# Patient Record
Sex: Female | Born: 1950
Health system: Southern US, Community
[De-identification: ages and names within clinical notes are randomized; demographics above are authoritative.]

## PROBLEM LIST (undated history)

## (undated) DIAGNOSIS — I10 Essential (primary) hypertension: Secondary | ICD-10-CM

## (undated) DIAGNOSIS — E785 Hyperlipidemia, unspecified: Secondary | ICD-10-CM

## (undated) DIAGNOSIS — R0600 Dyspnea, unspecified: Secondary | ICD-10-CM

## (undated) HISTORY — DX: Essential (primary) hypertension: I10

## (undated) HISTORY — DX: Hyperlipidemia, unspecified: E78.5

---

## 2004-11-14 ENCOUNTER — Emergency Department: Payer: Self-pay | Admitting: Emergency Medicine

## 2014-01-24 DIAGNOSIS — E782 Mixed hyperlipidemia: Secondary | ICD-10-CM | POA: Diagnosis not present

## 2014-01-24 DIAGNOSIS — G4700 Insomnia, unspecified: Secondary | ICD-10-CM | POA: Diagnosis not present

## 2014-01-24 DIAGNOSIS — I10 Essential (primary) hypertension: Secondary | ICD-10-CM | POA: Diagnosis not present

## 2014-01-24 DIAGNOSIS — F331 Major depressive disorder, recurrent, moderate: Secondary | ICD-10-CM | POA: Diagnosis not present

## 2014-07-17 DIAGNOSIS — E559 Vitamin D deficiency, unspecified: Secondary | ICD-10-CM | POA: Diagnosis not present

## 2014-07-17 DIAGNOSIS — F331 Major depressive disorder, recurrent, moderate: Secondary | ICD-10-CM | POA: Diagnosis not present

## 2014-07-17 DIAGNOSIS — Z0001 Encounter for general adult medical examination with abnormal findings: Secondary | ICD-10-CM | POA: Diagnosis not present

## 2014-07-17 DIAGNOSIS — I10 Essential (primary) hypertension: Secondary | ICD-10-CM | POA: Diagnosis not present

## 2014-07-17 DIAGNOSIS — R0602 Shortness of breath: Secondary | ICD-10-CM | POA: Diagnosis not present

## 2014-07-17 DIAGNOSIS — E782 Mixed hyperlipidemia: Secondary | ICD-10-CM | POA: Diagnosis not present

## 2014-08-07 DIAGNOSIS — R079 Chest pain, unspecified: Secondary | ICD-10-CM | POA: Diagnosis not present

## 2014-08-07 DIAGNOSIS — E782 Mixed hyperlipidemia: Secondary | ICD-10-CM | POA: Diagnosis not present

## 2014-08-07 DIAGNOSIS — I10 Essential (primary) hypertension: Secondary | ICD-10-CM | POA: Diagnosis not present

## 2014-08-09 DIAGNOSIS — R079 Chest pain, unspecified: Secondary | ICD-10-CM | POA: Diagnosis not present

## 2014-08-09 DIAGNOSIS — R0602 Shortness of breath: Secondary | ICD-10-CM | POA: Diagnosis not present

## 2014-08-21 DIAGNOSIS — R0602 Shortness of breath: Secondary | ICD-10-CM | POA: Diagnosis not present

## 2014-08-21 DIAGNOSIS — I10 Essential (primary) hypertension: Secondary | ICD-10-CM | POA: Diagnosis not present

## 2014-08-21 DIAGNOSIS — Z79899 Other long term (current) drug therapy: Secondary | ICD-10-CM | POA: Diagnosis not present

## 2014-08-29 DIAGNOSIS — I119 Hypertensive heart disease without heart failure: Secondary | ICD-10-CM | POA: Diagnosis not present

## 2014-08-29 DIAGNOSIS — N184 Chronic kidney disease, stage 4 (severe): Secondary | ICD-10-CM | POA: Diagnosis not present

## 2014-09-06 DIAGNOSIS — Z79899 Other long term (current) drug therapy: Secondary | ICD-10-CM | POA: Diagnosis not present

## 2014-09-06 DIAGNOSIS — I10 Essential (primary) hypertension: Secondary | ICD-10-CM | POA: Diagnosis not present

## 2014-09-06 DIAGNOSIS — E782 Mixed hyperlipidemia: Secondary | ICD-10-CM | POA: Diagnosis not present

## 2014-09-06 DIAGNOSIS — N184 Chronic kidney disease, stage 4 (severe): Secondary | ICD-10-CM | POA: Diagnosis not present

## 2014-09-06 DIAGNOSIS — F331 Major depressive disorder, recurrent, moderate: Secondary | ICD-10-CM | POA: Diagnosis not present

## 2014-09-17 ENCOUNTER — Telehealth: Payer: Self-pay | Admitting: Gastroenterology

## 2014-09-17 NOTE — Telephone Encounter (Signed)
Colonoscopy triage °

## 2014-09-19 ENCOUNTER — Telehealth: Payer: Self-pay | Admitting: Gastroenterology

## 2014-09-19 NOTE — Telephone Encounter (Signed)
Patient not ready to schedule colonoscopy at this time

## 2014-09-19 NOTE — Telephone Encounter (Signed)
Patient does not want to schedule colonoscopy at this time

## 2014-10-10 DIAGNOSIS — Z1231 Encounter for screening mammogram for malignant neoplasm of breast: Secondary | ICD-10-CM | POA: Diagnosis not present

## 2014-10-16 DIAGNOSIS — I10 Essential (primary) hypertension: Secondary | ICD-10-CM | POA: Diagnosis not present

## 2014-10-17 DIAGNOSIS — I10 Essential (primary) hypertension: Secondary | ICD-10-CM | POA: Diagnosis not present

## 2014-10-17 DIAGNOSIS — E875 Hyperkalemia: Secondary | ICD-10-CM | POA: Diagnosis not present

## 2014-10-17 DIAGNOSIS — N3090 Cystitis, unspecified without hematuria: Secondary | ICD-10-CM | POA: Diagnosis not present

## 2014-10-17 DIAGNOSIS — E785 Hyperlipidemia, unspecified: Secondary | ICD-10-CM | POA: Diagnosis not present

## 2014-10-26 DIAGNOSIS — R928 Other abnormal and inconclusive findings on diagnostic imaging of breast: Secondary | ICD-10-CM | POA: Diagnosis not present

## 2014-10-26 DIAGNOSIS — R922 Inconclusive mammogram: Secondary | ICD-10-CM | POA: Diagnosis not present

## 2014-11-06 DIAGNOSIS — R7989 Other specified abnormal findings of blood chemistry: Secondary | ICD-10-CM | POA: Insufficient documentation

## 2014-11-06 DIAGNOSIS — I10 Essential (primary) hypertension: Secondary | ICD-10-CM | POA: Diagnosis not present

## 2014-11-08 DIAGNOSIS — F331 Major depressive disorder, recurrent, moderate: Secondary | ICD-10-CM | POA: Diagnosis not present

## 2014-11-08 DIAGNOSIS — G4700 Insomnia, unspecified: Secondary | ICD-10-CM | POA: Diagnosis not present

## 2014-11-08 DIAGNOSIS — N184 Chronic kidney disease, stage 4 (severe): Secondary | ICD-10-CM | POA: Diagnosis not present

## 2014-11-08 DIAGNOSIS — E875 Hyperkalemia: Secondary | ICD-10-CM | POA: Diagnosis not present

## 2014-11-08 DIAGNOSIS — E782 Mixed hyperlipidemia: Secondary | ICD-10-CM | POA: Diagnosis not present

## 2015-02-01 DIAGNOSIS — I10 Essential (primary) hypertension: Secondary | ICD-10-CM | POA: Diagnosis not present

## 2015-02-07 DIAGNOSIS — G4700 Insomnia, unspecified: Secondary | ICD-10-CM | POA: Diagnosis not present

## 2015-02-07 DIAGNOSIS — I119 Hypertensive heart disease without heart failure: Secondary | ICD-10-CM | POA: Diagnosis not present

## 2015-02-07 DIAGNOSIS — I10 Essential (primary) hypertension: Secondary | ICD-10-CM | POA: Diagnosis not present

## 2015-03-04 DIAGNOSIS — I10 Essential (primary) hypertension: Secondary | ICD-10-CM | POA: Diagnosis not present

## 2015-03-04 DIAGNOSIS — E875 Hyperkalemia: Secondary | ICD-10-CM | POA: Diagnosis not present

## 2015-03-06 DIAGNOSIS — N183 Chronic kidney disease, stage 3 (moderate): Secondary | ICD-10-CM

## 2015-03-06 DIAGNOSIS — N1831 Chronic kidney disease, stage 3a: Secondary | ICD-10-CM | POA: Insufficient documentation

## 2015-06-13 DIAGNOSIS — I10 Essential (primary) hypertension: Secondary | ICD-10-CM | POA: Diagnosis not present

## 2015-06-13 DIAGNOSIS — E782 Mixed hyperlipidemia: Secondary | ICD-10-CM | POA: Diagnosis not present

## 2015-07-08 ENCOUNTER — Ambulatory Visit
Admission: RE | Admit: 2015-07-08 | Discharge: 2015-07-08 | Disposition: A | Discharge status: Home / Self Care | Payer: Medicare Other | Source: Ambulatory Visit | Attending: Physician Assistant | Admitting: Physician Assistant

## 2015-07-08 ENCOUNTER — Inpatient Hospital Stay: Admit: 2015-07-08 | Payer: Self-pay

## 2015-07-08 ENCOUNTER — Other Ambulatory Visit: Payer: Self-pay | Admitting: Physician Assistant

## 2015-07-08 DIAGNOSIS — I998 Other disorder of circulatory system: Secondary | ICD-10-CM | POA: Diagnosis not present

## 2015-07-08 DIAGNOSIS — I708 Atherosclerosis of other arteries: Secondary | ICD-10-CM | POA: Diagnosis not present

## 2015-07-08 DIAGNOSIS — M47896 Other spondylosis, lumbar region: Secondary | ICD-10-CM | POA: Diagnosis not present

## 2015-07-08 DIAGNOSIS — M5440 Lumbago with sciatica, unspecified side: Secondary | ICD-10-CM

## 2015-07-08 DIAGNOSIS — M4316 Spondylolisthesis, lumbar region: Secondary | ICD-10-CM | POA: Insufficient documentation

## 2015-07-08 DIAGNOSIS — M545 Low back pain: Secondary | ICD-10-CM | POA: Diagnosis present

## 2015-07-08 DIAGNOSIS — I700 Atherosclerosis of aorta: Secondary | ICD-10-CM | POA: Diagnosis not present

## 2015-07-08 DIAGNOSIS — M47816 Spondylosis without myelopathy or radiculopathy, lumbar region: Secondary | ICD-10-CM | POA: Diagnosis not present

## 2015-07-23 DIAGNOSIS — M5442 Lumbago with sciatica, left side: Secondary | ICD-10-CM | POA: Diagnosis not present

## 2015-07-23 DIAGNOSIS — I739 Peripheral vascular disease, unspecified: Secondary | ICD-10-CM | POA: Diagnosis not present

## 2015-07-23 DIAGNOSIS — E782 Mixed hyperlipidemia: Secondary | ICD-10-CM | POA: Diagnosis not present

## 2015-07-23 DIAGNOSIS — Z0001 Encounter for general adult medical examination with abnormal findings: Secondary | ICD-10-CM | POA: Diagnosis not present

## 2015-07-23 DIAGNOSIS — Z124 Encounter for screening for malignant neoplasm of cervix: Secondary | ICD-10-CM | POA: Diagnosis not present

## 2015-07-23 DIAGNOSIS — I119 Hypertensive heart disease without heart failure: Secondary | ICD-10-CM | POA: Diagnosis not present

## 2015-07-31 DIAGNOSIS — I739 Peripheral vascular disease, unspecified: Secondary | ICD-10-CM | POA: Diagnosis not present

## 2015-09-13 DIAGNOSIS — I6523 Occlusion and stenosis of bilateral carotid arteries: Secondary | ICD-10-CM | POA: Diagnosis not present

## 2015-09-13 DIAGNOSIS — N184 Chronic kidney disease, stage 4 (severe): Secondary | ICD-10-CM | POA: Diagnosis not present

## 2015-09-13 DIAGNOSIS — I10 Essential (primary) hypertension: Secondary | ICD-10-CM | POA: Diagnosis not present

## 2015-09-13 DIAGNOSIS — E782 Mixed hyperlipidemia: Secondary | ICD-10-CM | POA: Diagnosis not present

## 2015-09-13 DIAGNOSIS — M5442 Lumbago with sciatica, left side: Secondary | ICD-10-CM | POA: Diagnosis not present

## 2015-12-03 DIAGNOSIS — I10 Essential (primary) hypertension: Secondary | ICD-10-CM | POA: Diagnosis not present

## 2015-12-03 DIAGNOSIS — M722 Plantar fascial fibromatosis: Secondary | ICD-10-CM | POA: Diagnosis not present

## 2015-12-03 DIAGNOSIS — N184 Chronic kidney disease, stage 4 (severe): Secondary | ICD-10-CM | POA: Diagnosis not present

## 2016-03-10 DIAGNOSIS — M79672 Pain in left foot: Secondary | ICD-10-CM | POA: Diagnosis not present

## 2016-03-10 DIAGNOSIS — M19079 Primary osteoarthritis, unspecified ankle and foot: Secondary | ICD-10-CM | POA: Diagnosis not present

## 2016-03-10 DIAGNOSIS — M79671 Pain in right foot: Secondary | ICD-10-CM | POA: Diagnosis not present

## 2016-04-01 DIAGNOSIS — I6523 Occlusion and stenosis of bilateral carotid arteries: Secondary | ICD-10-CM | POA: Diagnosis not present

## 2016-07-01 DIAGNOSIS — N184 Chronic kidney disease, stage 4 (severe): Secondary | ICD-10-CM | POA: Diagnosis not present

## 2016-07-01 DIAGNOSIS — I6523 Occlusion and stenosis of bilateral carotid arteries: Secondary | ICD-10-CM | POA: Diagnosis not present

## 2016-07-01 DIAGNOSIS — I10 Essential (primary) hypertension: Secondary | ICD-10-CM | POA: Diagnosis not present

## 2016-07-07 DIAGNOSIS — R809 Proteinuria, unspecified: Secondary | ICD-10-CM | POA: Diagnosis not present

## 2016-07-07 DIAGNOSIS — N184 Chronic kidney disease, stage 4 (severe): Secondary | ICD-10-CM | POA: Diagnosis not present

## 2016-07-07 DIAGNOSIS — I10 Essential (primary) hypertension: Secondary | ICD-10-CM | POA: Diagnosis not present

## 2016-07-07 DIAGNOSIS — Z0001 Encounter for general adult medical examination with abnormal findings: Secondary | ICD-10-CM | POA: Diagnosis not present

## 2016-07-07 DIAGNOSIS — E559 Vitamin D deficiency, unspecified: Secondary | ICD-10-CM | POA: Diagnosis not present

## 2016-07-07 DIAGNOSIS — I739 Peripheral vascular disease, unspecified: Secondary | ICD-10-CM | POA: Diagnosis not present

## 2016-07-14 DIAGNOSIS — N184 Chronic kidney disease, stage 4 (severe): Secondary | ICD-10-CM | POA: Diagnosis not present

## 2016-07-14 DIAGNOSIS — Z7689 Persons encountering health services in other specified circumstances: Secondary | ICD-10-CM | POA: Diagnosis not present

## 2016-07-14 DIAGNOSIS — R809 Proteinuria, unspecified: Secondary | ICD-10-CM | POA: Diagnosis not present

## 2016-07-28 DIAGNOSIS — N390 Urinary tract infection, site not specified: Secondary | ICD-10-CM | POA: Diagnosis not present

## 2016-07-28 DIAGNOSIS — R42 Dizziness and giddiness: Secondary | ICD-10-CM | POA: Diagnosis not present

## 2016-07-28 DIAGNOSIS — E559 Vitamin D deficiency, unspecified: Secondary | ICD-10-CM | POA: Diagnosis not present

## 2016-07-28 DIAGNOSIS — I10 Essential (primary) hypertension: Secondary | ICD-10-CM | POA: Diagnosis not present

## 2016-07-28 DIAGNOSIS — Z0001 Encounter for general adult medical examination with abnormal findings: Secondary | ICD-10-CM | POA: Diagnosis not present

## 2016-07-28 DIAGNOSIS — E782 Mixed hyperlipidemia: Secondary | ICD-10-CM | POA: Diagnosis not present

## 2016-08-03 DIAGNOSIS — R809 Proteinuria, unspecified: Secondary | ICD-10-CM | POA: Diagnosis not present

## 2016-08-03 DIAGNOSIS — I10 Essential (primary) hypertension: Secondary | ICD-10-CM | POA: Diagnosis not present

## 2016-08-03 DIAGNOSIS — N184 Chronic kidney disease, stage 4 (severe): Secondary | ICD-10-CM | POA: Diagnosis not present

## 2016-08-03 DIAGNOSIS — N2581 Secondary hyperparathyroidism of renal origin: Secondary | ICD-10-CM | POA: Diagnosis not present

## 2016-08-10 ENCOUNTER — Other Ambulatory Visit: Payer: Self-pay | Admitting: Nephrology

## 2016-08-10 DIAGNOSIS — Z905 Acquired absence of kidney: Secondary | ICD-10-CM

## 2016-08-12 DIAGNOSIS — Z1231 Encounter for screening mammogram for malignant neoplasm of breast: Secondary | ICD-10-CM | POA: Diagnosis not present

## 2016-08-17 ENCOUNTER — Ambulatory Visit
Admission: RE | Admit: 2016-08-17 | Discharge: 2016-08-17 | Disposition: A | Discharge status: Home / Self Care | Payer: Medicare Other | Source: Ambulatory Visit | Attending: Nephrology | Admitting: Nephrology

## 2016-08-17 DIAGNOSIS — I700 Atherosclerosis of aorta: Secondary | ICD-10-CM | POA: Insufficient documentation

## 2016-08-17 DIAGNOSIS — N2889 Other specified disorders of kidney and ureter: Secondary | ICD-10-CM | POA: Diagnosis not present

## 2016-08-17 DIAGNOSIS — Z905 Acquired absence of kidney: Secondary | ICD-10-CM | POA: Diagnosis present

## 2016-08-17 DIAGNOSIS — N261 Atrophy of kidney (terminal): Secondary | ICD-10-CM | POA: Diagnosis not present

## 2016-08-25 DIAGNOSIS — R809 Proteinuria, unspecified: Secondary | ICD-10-CM | POA: Diagnosis not present

## 2016-08-25 DIAGNOSIS — I10 Essential (primary) hypertension: Secondary | ICD-10-CM | POA: Diagnosis not present

## 2016-08-25 DIAGNOSIS — N184 Chronic kidney disease, stage 4 (severe): Secondary | ICD-10-CM | POA: Diagnosis not present

## 2016-10-20 DIAGNOSIS — N184 Chronic kidney disease, stage 4 (severe): Secondary | ICD-10-CM | POA: Diagnosis not present

## 2016-10-20 DIAGNOSIS — R809 Proteinuria, unspecified: Secondary | ICD-10-CM | POA: Diagnosis not present

## 2016-10-20 DIAGNOSIS — N2581 Secondary hyperparathyroidism of renal origin: Secondary | ICD-10-CM | POA: Diagnosis not present

## 2016-10-20 DIAGNOSIS — I10 Essential (primary) hypertension: Secondary | ICD-10-CM | POA: Diagnosis not present

## 2016-10-26 DIAGNOSIS — I10 Essential (primary) hypertension: Secondary | ICD-10-CM | POA: Diagnosis not present

## 2016-10-26 DIAGNOSIS — N184 Chronic kidney disease, stage 4 (severe): Secondary | ICD-10-CM | POA: Diagnosis not present

## 2016-10-26 DIAGNOSIS — N390 Urinary tract infection, site not specified: Secondary | ICD-10-CM | POA: Diagnosis not present

## 2016-10-27 DIAGNOSIS — N184 Chronic kidney disease, stage 4 (severe): Secondary | ICD-10-CM | POA: Diagnosis not present

## 2016-10-27 DIAGNOSIS — I10 Essential (primary) hypertension: Secondary | ICD-10-CM | POA: Diagnosis not present

## 2016-10-27 DIAGNOSIS — R809 Proteinuria, unspecified: Secondary | ICD-10-CM | POA: Diagnosis not present

## 2016-10-27 DIAGNOSIS — N2581 Secondary hyperparathyroidism of renal origin: Secondary | ICD-10-CM | POA: Diagnosis not present

## 2016-11-16 ENCOUNTER — Encounter (INDEPENDENT_AMBULATORY_CARE_PROVIDER_SITE_OTHER): Payer: Self-pay

## 2016-11-16 ENCOUNTER — Encounter: Payer: Self-pay | Admitting: Gastroenterology

## 2016-11-16 ENCOUNTER — Ambulatory Visit (INDEPENDENT_AMBULATORY_CARE_PROVIDER_SITE_OTHER): Payer: Medicare Other | Admitting: Gastroenterology

## 2016-11-16 VITALS — BP 136/76 | HR 84 | Temp 97.8°F | Ht 60.0 in | Wt 131.4 lb

## 2016-11-16 DIAGNOSIS — R933 Abnormal findings on diagnostic imaging of other parts of digestive tract: Secondary | ICD-10-CM

## 2016-11-16 DIAGNOSIS — E875 Hyperkalemia: Secondary | ICD-10-CM | POA: Insufficient documentation

## 2016-11-16 DIAGNOSIS — I10 Essential (primary) hypertension: Secondary | ICD-10-CM | POA: Insufficient documentation

## 2016-11-16 DIAGNOSIS — E785 Hyperlipidemia, unspecified: Secondary | ICD-10-CM | POA: Insufficient documentation

## 2016-11-16 NOTE — Progress Notes (Signed)
[name] MD, MRCP(U.K) [address]  [place]  [place], [place] [address]  Main: [phone]  Fax: [phone]   Gastroenterology Consultation  Referring Provider:     Associates, Sanders Car* Primary Care Physician:  Lavera Guise, MD Primary Gastroenterologist:  Dr. [name]  Reason for Consultation:     Abnormal CT        HPI:   Julia Davidson is a 66 y.o. y/o female referred for consultation & management  by Dr. Humphrey Rolls, Timoteo Gaul, MD. She has been referred for abnormality of her recent CAT scan which revealed a distal esophageal and proximal stomach wall thickening.  The CT scan was performed on 08/17/2016.  And she was referred here to undergo an endoscopy.The  CT scan was ordered for indications of an absent    Denies any issues with swallowing , gained and lost some weight . No family history of esophageal cancer. She chews tobacco.   No past medical history on file.  No past surgical history on file.  Prior to Admission medications   Medication Sig Start Date End Date Taking? Authorizing Provider  amLODipine (NORVASC) 10 MG tablet Take 10 mg by mouth. 11/06/14  Yes [provider]  chlorthalidone (HYGROTON) 25 MG tablet Take 12.5 mg by mouth. 02/07/15  Yes [provider]  aspirin (GOODSENSE ASPIRIN) 325 MG tablet Take 325 mg by mouth.    [provider]  chlorthalidone (HYGROTON) 25 MG tablet Take 25 mg daily by mouth. 10/25/16   [provider]  citalopram (CELEXA) 20 MG tablet take 1 tablet by mouth once daily WITH SUPPER FOR DEPRESSION 10/26/16   [provider]  D3-50 50000 units capsule  10/09/16   [provider]  hydrALAZINE (APRESOLINE) 25 MG tablet Take 25 mg 3 (three) times daily by mouth. 10/20/16   [provider]  losartan (COZAAR) 50 MG tablet Take 50 mg daily by mouth. 11/06/16   [provider]  PROAIR HFA 108 [phone] Base) MCG/ACT inhaler take 2 puffs by mouth every 6 hours if  needed 10/26/16   [provider]  simvastatin (ZOCOR) 40 MG tablet  10/25/16   [provider]  traZODone (DESYREL) 50 MG tablet Take 50 mg at bedtime by mouth. 09/08/16   [provider]    No family history on file.   Social History   Tobacco Use  . Smoking status: Not on file  Substance Use Topics  . Alcohol use: Not on file  . Drug use: Not on file    Allergies as of 11/16/2016  . (Not on File)    Review of Systems:    All systems reviewed and negative except where noted in HPI.   Physical Exam:  There were no vitals taken for this visit. No LMP recorded. Psych:  Alert and cooperative. Normal mood and affect. General:   Alert,  Well-developed, well-nourished, pleasant and cooperative in NAD Head:  Normocephalic and atraumatic. Eyes:  Sclera clear, no icterus.   Conjunctiva pink. Ears:  Normal auditory acuity. Nose:  No deformity, discharge, or lesions. Mouth:  No deformity or lesions,oropharynx pink & moist. Neck:  Supple; no masses or thyromegaly. Lungs:  Respirations even and unlabored.  Clear throughout to auscultation.   No wheezes, crackles, or rhonchi. No acute distress. Heart:  Regular rate and rhythm; no murmurs, clicks, rubs, or gallops. Abdomen:  Normal bowel sounds.  No bruits.  Soft, non-tender and non-distended without masses, hepatosplenomegaly or hernias noted.  No guarding or rebound tenderness.    Neurologic:  Alert and oriented x3;  grossly normal neurologically. Skin:  Intact without significant lesions or rashes. No jaundice. Lymph Nodes:  No significant cervical adenopathy. Psych:  Alert and cooperative. Normal mood and affect.  Imaging Studies: No results found.  Assessment and Plan:   Julia Davidson is a 66 y.o. y/o female has been referred for an abnormal CT scan of the abdomen which revealed thickening of the distal esophagus and stomach.  I have recommended an upper endoscopy to evaluate further.H/o chewing  tobacco, otherwise no symptoms.    I have discussed alternative options, risks & benefits,  which include, but are not limited to, bleeding, infection, perforation,respiratory complication & drug reaction.  The patient agrees with this plan & written consent will be obtained.    Follow up PRN  Dr [name] MD,MRCP(U.K)

## 2016-11-19 ENCOUNTER — Encounter: Payer: Self-pay | Admitting: Anesthesiology

## 2016-11-19 ENCOUNTER — Ambulatory Visit: Payer: Medicare Other | Admitting: Anesthesiology

## 2016-11-19 ENCOUNTER — Ambulatory Visit
Admission: RE | Admit: 2016-11-19 | Discharge: 2016-11-19 | Disposition: A | Discharge status: Home / Self Care | Payer: Medicare Other | Source: Ambulatory Visit | Attending: Gastroenterology | Admitting: Gastroenterology

## 2016-11-19 ENCOUNTER — Encounter
Admission: RE | Disposition: A | Discharge status: Home / Self Care | Payer: Self-pay | Source: Ambulatory Visit | Attending: Gastroenterology

## 2016-11-19 DIAGNOSIS — K229 Disease of esophagus, unspecified: Secondary | ICD-10-CM

## 2016-11-19 DIAGNOSIS — Z7982 Long term (current) use of aspirin: Secondary | ICD-10-CM | POA: Insufficient documentation

## 2016-11-19 DIAGNOSIS — E785 Hyperlipidemia, unspecified: Secondary | ICD-10-CM | POA: Insufficient documentation

## 2016-11-19 DIAGNOSIS — K2980 Duodenitis without bleeding: Secondary | ICD-10-CM | POA: Diagnosis not present

## 2016-11-19 DIAGNOSIS — R933 Abnormal findings on diagnostic imaging of other parts of digestive tract: Secondary | ICD-10-CM

## 2016-11-19 DIAGNOSIS — F17220 Nicotine dependence, chewing tobacco, uncomplicated: Secondary | ICD-10-CM | POA: Diagnosis not present

## 2016-11-19 DIAGNOSIS — K2990 Gastroduodenitis, unspecified, without bleeding: Secondary | ICD-10-CM

## 2016-11-19 DIAGNOSIS — R935 Abnormal findings on diagnostic imaging of other abdominal regions, including retroperitoneum: Secondary | ICD-10-CM | POA: Diagnosis present

## 2016-11-19 DIAGNOSIS — K228 Other specified diseases of esophagus: Secondary | ICD-10-CM | POA: Diagnosis not present

## 2016-11-19 DIAGNOSIS — K209 Esophagitis, unspecified: Secondary | ICD-10-CM | POA: Diagnosis not present

## 2016-11-19 DIAGNOSIS — K2970 Gastritis, unspecified, without bleeding: Secondary | ICD-10-CM | POA: Diagnosis not present

## 2016-11-19 DIAGNOSIS — I10 Essential (primary) hypertension: Secondary | ICD-10-CM | POA: Insufficient documentation

## 2016-11-19 DIAGNOSIS — Z79899 Other long term (current) drug therapy: Secondary | ICD-10-CM | POA: Diagnosis not present

## 2016-11-19 DIAGNOSIS — K449 Diaphragmatic hernia without obstruction or gangrene: Secondary | ICD-10-CM | POA: Diagnosis not present

## 2016-11-19 DIAGNOSIS — D130 Benign neoplasm of esophagus: Secondary | ICD-10-CM | POA: Diagnosis not present

## 2016-11-19 DIAGNOSIS — K529 Noninfective gastroenteritis and colitis, unspecified: Secondary | ICD-10-CM | POA: Diagnosis not present

## 2016-11-19 HISTORY — PX: ESOPHAGOGASTRODUODENOSCOPY (EGD) WITH PROPOFOL: SHX5813

## 2016-11-19 SURGERY — ESOPHAGOGASTRODUODENOSCOPY (EGD) WITH PROPOFOL
Anesthesia: General

## 2016-11-19 MED ORDER — LIDOCAINE HCL (PF) 1 % IJ SOLN
INTRAMUSCULAR | Status: AC
  Administered 2016-11-19: 0.3 mL via INTRADERMAL
  Filled 2016-11-19: qty 2

## 2016-11-19 MED ORDER — GLYCOPYRROLATE 0.2 MG/ML IJ SOLN
INTRAMUSCULAR | Status: AC
  Filled 2016-11-19: qty 1

## 2016-11-19 MED ORDER — FENTANYL CITRATE (PF) 100 MCG/2ML IJ SOLN: 25.0000 ug | INTRAMUSCULAR | Status: DC | PRN

## 2016-11-19 MED ORDER — PROPOFOL 10 MG/ML IV BOLUS
INTRAVENOUS | Status: AC
  Filled 2016-11-19: qty 20

## 2016-11-19 MED ORDER — OMEPRAZOLE 40 MG PO CPDR: 40.0000 mg | DELAYED_RELEASE_CAPSULE | Freq: Every day | ORAL | 1 refills | Status: DC

## 2016-11-19 MED ORDER — PROPOFOL 500 MG/50ML IV EMUL
INTRAVENOUS | Status: DC | PRN
  Administered 2016-11-19: 50 ug/kg/min via INTRAVENOUS

## 2016-11-19 MED ORDER — ONDANSETRON HCL 4 MG/2ML IJ SOLN: 4.0000 mg | Freq: Once | INTRAMUSCULAR | Status: DC | PRN

## 2016-11-19 MED ORDER — PROPOFOL 10 MG/ML IV BOLUS
INTRAVENOUS | Status: DC | PRN
  Administered 2016-11-19 (×9): 10 mg via INTRAVENOUS

## 2016-11-19 MED ORDER — SODIUM CHLORIDE 0.9 % IV SOLN
INTRAVENOUS | Status: DC
  Administered 2016-11-19: 1000 mL via INTRAVENOUS

## 2016-11-19 MED ORDER — LIDOCAINE HCL (PF) 1 % IJ SOLN
2.0000 mL | Freq: Once | INTRAMUSCULAR | Status: AC
  Administered 2016-11-19: 0.3 mL via INTRADERMAL

## 2016-11-19 NOTE — Op Note (Signed)
[facility] Gastroenterology Patient Name: Julia Davidson Procedure Date: 11/19/2016 10:02 AM MRN: [id number] Account #: [id number] Date of Birth: 07-02-1950 Admit Type: Outpatient Age: 66 Room: [facility] ENDO ROOM 3 Gender: Female Note Status: Finalized Procedure:            Upper GI endoscopy Indications:          Abnormal CT of the GI tract Providers:            [name] MD, MD Referring MD:         Lavera Guise, MD (Referring MD) Medicines:            Monitored Anesthesia Care Complications:        No immediate complications. Procedure:            Pre-Anesthesia Assessment:                       - Prior to the procedure, a History and Physical was                        performed, and patient medications, allergies and                        sensitivities were reviewed. The patient's tolerance of                        previous anesthesia was reviewed.                       - The risks and benefits of the procedure and the                        sedation options and risks were discussed with the                        patient. All questions were answered and informed                        consent was obtained.                       - ASA Grade Assessment: III - A patient with severe                        systemic disease.                       After obtaining informed consent, the endoscope was                        passed under direct vision. Throughout the procedure,                        the patient's blood pressure, pulse, and oxygen                        saturations were monitored continuously. The Endoscope                        was introduced through the mouth, and advanced to the  third part of duodenum. The upper GI endoscopy was                        accomplished with ease. The patient tolerated the                        procedure. Findings:      Patchy moderate inflammation characterized by congestion (edema) and        erythema was found in the duodenal bulb. Biopsies were taken with a cold       forceps for histology.      A 4 cm hiatal hernia was present.      Patchy moderate inflammation characterized by adherent blood, erosions,       erythema and aphthous ulcerations was found in the gastric antrum and in       the prepyloric region of the stomach. Biopsies were taken with a cold       forceps for histology.      The cardia and gastric fundus were normal on retroflexion.      The Z-line was irregular. Biopsies were taken with a cold forceps for       histology.      One 7 mm polyp with no bleeding was found 20 cm from the incisors. The       polyp was removed with a cold biopsy forceps. Resection and retrieval       were complete. Impression:           - Duodenitis. Biopsied.                       - 4 cm hiatal hernia.                       - Gastritis. Biopsied.                       - Z-line irregular. Biopsied.                       - Esophageal polyp(s) were found. Resected and                        retrieved. Recommendation:       - Discharge patient to home (with escort).                       - Resume previous diet.                       - Continue present medications.                       - Await pathology results.                       - Return to my office in 6 weeks.                       - No ibuprofen, naproxen, or other non-steroidal                        anti-inflammatory drugs. Procedure Code(s):    --- Professional ---  62229, Esophagogastroduodenoscopy, flexible, transoral;                        with biopsy, single or multiple Diagnosis Code(s):    --- Professional ---                       K29.80, Duodenitis without bleeding                       K44.9, Diaphragmatic hernia without obstruction or                        gangrene                       K29.70, Gastritis, unspecified, without bleeding                       K22.8, Other specified  diseases of esophagus                       R93.3, Abnormal findings on diagnostic imaging of other                        parts of digestive tract CPT copyright 2016 American Medical Association. All rights reserved. The codes documented in this report are preliminary and upon coder review may  be revised to meet current compliance requirements. [name], MD [name] MD, MD 11/19/2016 10:29:04 AM This report has been signed electronically. Number of Addenda: 0 Note Initiated On: 11/19/2016 10:02 AM      [facility]

## 2016-11-19 NOTE — Anesthesia Post-op Follow-up Note (Signed)
Anesthesia QCDR form completed.        

## 2016-11-19 NOTE — Anesthesia Postprocedure Evaluation (Signed)
Anesthesia Post Note  Patient: Shasta Pasley  Procedure(s) Performed: ESOPHAGOGASTRODUODENOSCOPY (EGD) WITH PROPOFOL (N/A )  Patient location during evaluation: PACU Anesthesia Type: General Level of consciousness: awake and alert and oriented Pain management: pain level controlled Vital Signs Assessment: post-procedure vital signs reviewed and stable Respiratory status: spontaneous breathing Cardiovascular status: blood pressure returned to baseline Anesthetic complications: no     Last Vitals:  Vitals:   11/19/16 1051 11/19/16 1101  BP: (!) 148/88 (!) 168/90  Pulse:  98  Resp:  18  Temp:    SpO2:      Last Pain:  Vitals:   11/19/16 1031  TempSrc: Tympanic                 Jenaro Souder

## 2016-11-19 NOTE — Anesthesia Preprocedure Evaluation (Addendum)
Anesthesia Evaluation  Patient identified by MRN, date of birth, ID band Patient awake    Reviewed: Allergy & Precautions, NPO status , Patient's Chart, lab work & pertinent test results  Airway Mallampati: II  TM Distance: >3 FB     Dental  (+) Upper Dentures   Pulmonary neg pulmonary ROS,    Pulmonary exam normal        Cardiovascular hypertension, Pt. on medications Normal cardiovascular exam     Neuro/Psych negative neurological ROS  negative psych ROS   GI/Hepatic negative GI ROS, Neg liver ROS,   Endo/Other  negative endocrine ROS  Renal/GU Renal InsufficiencyRenal disease  negative genitourinary   Musculoskeletal negative musculoskeletal ROS (+)   Abdominal Normal abdominal exam  (+)   Peds negative pediatric ROS (+)  Hematology negative hematology ROS (+)   Anesthesia Other Findings   Reproductive/Obstetrics                            Anesthesia Physical Anesthesia Plan  ASA: III  Anesthesia Plan: General   Post-op Pain Management:    Induction: Intravenous  PONV Risk Score and Plan:   Airway Management Planned: Nasal Cannula  Additional Equipment:   Intra-op Plan:   Post-operative Plan:   Informed Consent: I have reviewed the patients History and Physical, chart, labs and discussed the procedure including the risks, benefits and alternatives for the proposed anesthesia with the patient or authorized representative who has indicated his/her understanding and acceptance.   Dental advisory given  Plan Discussed with: CRNA  Anesthesia Plan Comments:         Anesthesia Quick Evaluation

## 2016-11-19 NOTE — Transfer of Care (Signed)
Immediate Anesthesia Transfer of Care Note  Patient: Julia Davidson  Procedure(s) Performed: ESOPHAGOGASTRODUODENOSCOPY (EGD) WITH PROPOFOL (N/A )  Patient Location: PACU and Endoscopy Unit  Anesthesia Type:MAC  Level of Consciousness: awake  Airway & Oxygen Therapy: Patient Spontanous Breathing  Post-op Assessment: Report given to RN  Post vital signs: Reviewed  Last Vitals:  Vitals:   11/19/16 0926 11/19/16 1031  BP: 133/70 124/68  Pulse: 74 (!) 118  Resp: 16 20  Temp: (!) 36.2 C 36.6 C  SpO2: 100% 100%    Last Pain:  Vitals:   11/19/16 1031  TempSrc: Tympanic         Complications: No apparent anesthesia complications

## 2016-11-19 NOTE — H&P (Signed)
[name], MD [address] [place], [place], [place], [address] [id number] [place], [place], [place], [address] Phone: [phone]  Fax: [phone]  Primary Care Physician:  Lavera Guise, MD   Pre-Procedure History & Physical: HPI:  Julia Davidson is a 66 y.o. female is here for an endoscopy    Past Medical History:  Diagnosis Date  . Hyperlipidemia   . Hypertension     History reviewed. No pertinent surgical history.  Prior to Admission medications   Medication Sig Start Date End Date Taking? Authorizing Provider  amLODipine (NORVASC) 10 MG tablet Take 10 mg by mouth. 11/06/14   [provider]  aspirin (GOODSENSE ASPIRIN) 325 MG tablet Take 325 mg by mouth.    [provider]  chlorthalidone (HYGROTON) 25 MG tablet Take 12.5 mg by mouth. 02/07/15   [provider]  chlorthalidone (HYGROTON) 25 MG tablet Take 25 mg daily by mouth. 10/25/16   [provider]  citalopram (CELEXA) 20 MG tablet take 1 tablet by mouth once daily WITH SUPPER FOR DEPRESSION 10/26/16   [provider]  D3-50 50000 units capsule  10/09/16   [provider]  hydrALAZINE (APRESOLINE) 25 MG tablet Take 25 mg 3 (three) times daily by mouth. 10/20/16   [provider]  losartan (COZAAR) 50 MG tablet Take 50 mg daily by mouth. 11/06/16   [provider]  PROAIR HFA 108 [phone] Base) MCG/ACT inhaler take 2 puffs by mouth every 6 hours if needed 10/26/16   [provider]  simvastatin (ZOCOR) 40 MG tablet  10/25/16   [provider]  traZODone (DESYREL) 50 MG tablet Take 50 mg at bedtime by mouth. 09/08/16   [provider]    Allergies as of 11/17/2016  . (Not on File)    History reviewed. No pertinent family history.  Social History   Socioeconomic History  . Marital status: Divorced    Spouse name: Not on file  . Number of children: Not on file  . Years of education: Not on file  . Highest  education level: Not on file  Social Needs  . Financial resource strain: Not on file  . Food insecurity - worry: Not on file  . Food insecurity - inability: Not on file  . Transportation needs - medical: Not on file  . Transportation needs - non-medical: Not on file  Occupational History  . Not on file  Tobacco Use  . Smoking status: Never Smoker  . Smokeless tobacco: Current User    Types: Chew  Substance and Sexual Activity  . Alcohol use: Yes    Alcohol/week: 1.2 oz    Types: 1 Glasses of wine, 1 Shots of liquor per week  . Drug use: No  . Sexual activity: Yes  Other Topics Concern  . Not on file  Social History Narrative  . Not on file    Review of Systems: See HPI, otherwise negative ROS  Physical Exam: BP 133/70   Pulse 74   Temp (!) 97.2 F (36.2 C) (Tympanic)   Resp 16   Ht 5\' 1"  (1.549 m)   Wt 129 lb (58.5 kg)   SpO2 100%   BMI 24.37 kg/m  General:   Alert,  pleasant and cooperative in NAD Head:  Normocephalic and atraumatic. Neck:  Supple; no masses or thyromegaly. Lungs:  Clear throughout to auscultation, normal respiratory effort.    Heart:  +S1, +S2, Regular rate and rhythm, No edema. Abdomen:  Soft, nontender and nondistended. Normal bowel sounds, without guarding, and without rebound.   Neurologic:  Alert and  oriented x4;  grossly normal neurologically.  Impression/Plan: Julia Davidson is here for an endoscopy  to be performed for  evaluation of abnormal Ct scan     Risks, benefits, limitations, and alternatives regarding endoscopy have been reviewed with the patient.  Questions have been answered.  All parties agreeable.   [name], MD  11/19/2016, 9:35 AM

## 2016-11-20 ENCOUNTER — Encounter: Payer: Self-pay | Admitting: Gastroenterology

## 2016-11-20 LAB — SURGICAL PATHOLOGY

## 2016-11-30 ENCOUNTER — Telehealth: Payer: Self-pay | Admitting: Gastroenterology

## 2016-11-30 NOTE — Telephone Encounter (Signed)
Patient called wanting procedure results.

## 2016-12-01 ENCOUNTER — Telehealth: Payer: Self-pay | Admitting: Gastroenterology

## 2016-12-01 NOTE — Telephone Encounter (Signed)
Patient LVM to call her with procedure results.

## 2016-12-02 NOTE — Telephone Encounter (Signed)
-----   Message from [name], MD sent at 11/30/2016  1:36 PM EST ----- Inform  1. Duodenitis- avoid nsaids- take Prilosec 40 mg a day before breakfast  2. Stomach bx showed gastritis 3. Esophagus bx showed acute esophagitis with ulcers - due to reflux- needs to take her PPI- repeat EGD in 8 weeks to ensure healing  4. 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

## 2016-12-02 NOTE — Telephone Encounter (Signed)
Attempted to contact pt and relay result information per Dr. Vicente Males.   Family member states she has gone to the store but they will advise her to callback.  Results: 1. Duodenitis- avoid nsaids- take Prilosec 40 mg a day before breakfast  2. Stomach bx showed gastritis  3. Esophagus bx showed acute esophagitis with ulcers - due to reflux- needs to take her PPI- repeat EGD in 8 weeks to ensure healing  4. 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

## 2016-12-04 ENCOUNTER — Telehealth: Payer: Self-pay

## 2016-12-04 ENCOUNTER — Other Ambulatory Visit: Payer: Self-pay

## 2016-12-04 DIAGNOSIS — K2210 Ulcer of esophagus without bleeding: Secondary | ICD-10-CM

## 2016-12-04 DIAGNOSIS — K229 Disease of esophagus, unspecified: Secondary | ICD-10-CM

## 2016-12-04 NOTE — Telephone Encounter (Signed)
Advised patient of results per Dr. Vicente Males.   1. Duodenitis- avoid nsaids- take Prilosec 40 mg a day before breakfast  2. Stomach bx showed gastritis  3. Esophagus bx showed acute esophagitis with ulcers - due to reflux- needs to take her PPI- repeat EGD in 8 weeks to ensure healing  4. 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   Pt has scheduled EGD for 01/25/17.

## 2016-12-04 NOTE — Telephone Encounter (Signed)
-----   Message from [name], MD sent at 11/30/2016  1:36 PM EST ----- Inform  1. Duodenitis- avoid nsaids- take Prilosec 40 mg a day before breakfast  2. Stomach bx showed gastritis 3. Esophagus bx showed acute esophagitis with ulcers - due to reflux- needs to take her PPI- repeat EGD in 8 weeks to ensure healing  4. 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

## 2016-12-21 ENCOUNTER — Encounter: Payer: Self-pay | Admitting: Gastroenterology

## 2016-12-21 ENCOUNTER — Encounter (INDEPENDENT_AMBULATORY_CARE_PROVIDER_SITE_OTHER): Payer: Self-pay

## 2016-12-21 ENCOUNTER — Ambulatory Visit (INDEPENDENT_AMBULATORY_CARE_PROVIDER_SITE_OTHER): Payer: Medicare Other | Admitting: Gastroenterology

## 2016-12-21 VITALS — BP 149/63 | Temp 98.2°F | Ht 60.0 in | Wt 128.6 lb

## 2016-12-21 DIAGNOSIS — K2210 Ulcer of esophagus without bleeding: Secondary | ICD-10-CM | POA: Diagnosis not present

## 2016-12-21 DIAGNOSIS — K209 Esophagitis, unspecified without bleeding: Secondary | ICD-10-CM

## 2016-12-21 DIAGNOSIS — K2990 Gastroduodenitis, unspecified, without bleeding: Secondary | ICD-10-CM

## 2016-12-21 DIAGNOSIS — K2970 Gastritis, unspecified, without bleeding: Secondary | ICD-10-CM | POA: Diagnosis not present

## 2016-12-21 NOTE — Addendum Note (Signed)
Addended by: Peggye Ley on: 12/21/2016 02:35 PM   Modules accepted: Miquel Dunn

## 2016-12-21 NOTE — Progress Notes (Signed)
[name] MD, MRCP(U.K) [address]  [place]  [place], [place] [address]  Main: [phone]  Fax: [phone]   Primary Care Physician: Lavera Guise, MD  Primary Gastroenterologist:  Dr. [name]   No chief complaint on file.   HPI: Julia Davidson is a 66 y.o. female    Summary of history : She is here today to follow-up to her last an initial visit on 11/16/2016 when she was referred for an abnormal CT scan.  The CT scan performed recently described an abnormality in the distal esophagus and thickening of her proximal wall on 08/17/2016.  At that point of time she denied any issues with swallowing, gained and lost some weight.  History of chewing tobacco.   Interval history 11/16/2016 to 12/21/2016 She underwent an upper endoscopy on 11/19/2016  I noted a 4 cm hiatal hernia.  Congestion erythema was noted in the duodenal bulb.  Biopsies were taken again I did note blood erosions erythema and aphthous ulcerations was formed in the gastric antrum.  Biopsies were taken.  The Z line was irregular biopsies were taken.  A 7 mm polyp with no bleeding was found of the 20 cm mark from the incisors and the esophagus.  The polyp was completely removed with a cold biopsy forceps.  Biopsies confirmed duodenitis.  Gastric biopsies demonstrated gastritis.  Esophageal biopsies showed acute esophagitis with ulcers which were taken of the GE junction.  And there was a polyp in the esophagus was returned back as a squamous papilloma which is benign.   No complaints since last visit- doing well , on ppi   Current Outpatient Medications  Medication Sig Dispense Refill  . amLODipine (NORVASC) 10 MG tablet Take 10 mg by mouth.    Marland Kitchen aspirin (GOODSENSE ASPIRIN) 325 MG tablet Take 325 mg by mouth.    . chlorthalidone (HYGROTON) 25 MG tablet Take 12.5 mg by mouth.    . chlorthalidone (HYGROTON) 25 MG tablet Take 25 mg daily by mouth.  0  . citalopram (CELEXA) 20 MG tablet take 1 tablet  by mouth once daily WITH SUPPER FOR DEPRESSION  0  . D3-50 50000 units capsule   0  . hydrALAZINE (APRESOLINE) 25 MG tablet Take 25 mg 3 (three) times daily by mouth.  0  . losartan (COZAAR) 50 MG tablet Take 50 mg daily by mouth.  0  . omeprazole (PRILOSEC) 40 MG capsule Take 1 capsule (40 mg total) daily by mouth. 30 capsule 1  . PROAIR HFA 108 (90 Base) MCG/ACT inhaler take 2 puffs by mouth every 6 hours if needed  0  . simvastatin (ZOCOR) 40 MG tablet   0  . traZODone (DESYREL) 50 MG tablet Take 50 mg at bedtime by mouth.  0   No current facility-administered medications for this visit.     Allergies as of 12/21/2016  . (No Known Allergies)    ROS:  General: Negative for anorexia, weight loss, fever, chills, fatigue, weakness. ENT: Negative for hoarseness, difficulty swallowing , nasal congestion. CV: Negative for chest pain, angina, palpitations, dyspnea on exertion, peripheral edema.  Respiratory: Negative for dyspnea at rest, dyspnea on exertion, cough, sputum, wheezing.  GI: See history of present illness. GU:  Negative for dysuria, hematuria, urinary incontinence, urinary frequency, nocturnal urination.  Endo: Negative for unusual weight change.    Physical Examination:   There were no vitals taken for this visit.  General: Well-nourished, well-developed in no acute distress.  Eyes: No icterus.  Conjunctivae pink. Mouth: Oropharyngeal mucosa moist and pink , no lesions erythema or exudate. Lungs: Clear to auscultation bilaterally. Non-labored. Heart: Regular rate and rhythm, no murmurs rubs or gallops.  Abdomen: Bowel sounds are normal, nontender, nondistended, no hepatosplenomegaly or masses, no abdominal bruits or hernia , no rebound or guarding.   Extremities: No lower extremity edema. No clubbing or deformities. Neuro: Alert and oriented x 3.  Grossly intact. Skin: Warm and dry, no jaundice.   Psych: Alert and cooperative, normal mood and affect.   Imaging  Studies: No results found.  Assessment and Plan:   Julia Davidson is a 66 y.o. y/o female  here today to follow-up to her recent endoscopy.  Recent endoscopy demonstrated duodenitis, gastritis, esophagitis with esophageal ulcers, squamous papilloma of the esophagus.  I explained to her that she needs to stop all NSAIDs continue taking her Prilosec 40 mg once a day before breakfast which she was started on on 11/30/2016.  We will plan to repeat her upper endoscopy in 2-3 months time to demonstrate healing of the esophagitis, esophageal ulcers and ensure that the squamous papilloma which is benign was completely excised and there is no residual tissue left behind.   I have discussed alternative options, risks & benefits,  which include, but are not limited to, bleeding, infection, perforation,respiratory complication & drug reaction.  The patient agrees with this plan & written consent will be obtained.     Dr [name]  MD,MRCP [facility]) Follow up in 4 months

## 2017-01-19 ENCOUNTER — Telehealth: Payer: Self-pay | Admitting: Gastroenterology

## 2017-01-19 NOTE — Telephone Encounter (Signed)
Patient LVM she needs to r/s her procedure on 01/21 w/ Dr. Vicente Males due to no transportation.

## 2017-01-22 ENCOUNTER — Telehealth: Payer: Self-pay

## 2017-01-22 NOTE — Telephone Encounter (Signed)
Contacted patient to reschedule EGD.   No answer. No voicemail available.

## 2017-01-22 NOTE — Telephone Encounter (Signed)
Procedure has been canceled based on 01/19/17 telephone encounter.  Tried to contact her to reschedule but no answer.  Thanks [employer]

## 2017-01-25 ENCOUNTER — Other Ambulatory Visit: Payer: Self-pay

## 2017-01-25 ENCOUNTER — Ambulatory Visit: Payer: Self-pay | Admitting: Nurse Practitioner

## 2017-01-25 ENCOUNTER — Encounter: Admission: RE | Payer: Self-pay | Source: Ambulatory Visit

## 2017-01-25 ENCOUNTER — Ambulatory Visit: Admission: RE | Admit: 2017-01-25 | Payer: Medicare Other | Source: Ambulatory Visit | Admitting: Gastroenterology

## 2017-01-25 DIAGNOSIS — K229 Disease of esophagus, unspecified: Secondary | ICD-10-CM

## 2017-01-25 DIAGNOSIS — K2210 Ulcer of esophagus without bleeding: Secondary | ICD-10-CM

## 2017-01-25 SURGERY — ESOPHAGOGASTRODUODENOSCOPY (EGD) WITH PROPOFOL
Anesthesia: General

## 2017-01-25 NOTE — Progress Notes (Signed)
Reschedule procedure due to transportation.   Patient is aware someone has to accompany and stay with her.  Patient has prep instructions.

## 2017-02-04 DIAGNOSIS — E872 Acidosis: Secondary | ICD-10-CM | POA: Diagnosis not present

## 2017-02-04 DIAGNOSIS — N184 Chronic kidney disease, stage 4 (severe): Secondary | ICD-10-CM | POA: Diagnosis not present

## 2017-02-04 DIAGNOSIS — N2581 Secondary hyperparathyroidism of renal origin: Secondary | ICD-10-CM | POA: Diagnosis not present

## 2017-02-04 DIAGNOSIS — I10 Essential (primary) hypertension: Secondary | ICD-10-CM | POA: Diagnosis not present

## 2017-02-09 ENCOUNTER — Telehealth: Payer: Self-pay

## 2017-02-09 MED ORDER — OMEPRAZOLE 40 MG PO CPDR: 40.0000 mg | DELAYED_RELEASE_CAPSULE | Freq: Every day | ORAL | 3 refills | Status: DC

## 2017-02-09 NOTE — Telephone Encounter (Signed)
Refilled per Rx script

## 2017-02-16 ENCOUNTER — Ambulatory Visit: Payer: Medicare Other | Admitting: Anesthesiology

## 2017-02-16 ENCOUNTER — Encounter: Payer: Self-pay | Admitting: *Deleted

## 2017-02-16 ENCOUNTER — Ambulatory Visit
Admission: AD | Admit: 2017-02-16 | Discharge: 2017-02-16 | Disposition: A | Discharge status: Home / Self Care | Payer: Medicare Other | Source: Ambulatory Visit | Attending: Gastroenterology | Admitting: Gastroenterology

## 2017-02-16 ENCOUNTER — Encounter
Admission: AD | Disposition: A | Discharge status: Home / Self Care | Payer: Self-pay | Source: Ambulatory Visit | Attending: Gastroenterology

## 2017-02-16 DIAGNOSIS — K229 Disease of esophagus, unspecified: Secondary | ICD-10-CM | POA: Diagnosis not present

## 2017-02-16 DIAGNOSIS — Z7982 Long term (current) use of aspirin: Secondary | ICD-10-CM | POA: Insufficient documentation

## 2017-02-16 DIAGNOSIS — Z79899 Other long term (current) drug therapy: Secondary | ICD-10-CM | POA: Insufficient documentation

## 2017-02-16 DIAGNOSIS — R0600 Dyspnea, unspecified: Secondary | ICD-10-CM | POA: Insufficient documentation

## 2017-02-16 DIAGNOSIS — I10 Essential (primary) hypertension: Secondary | ICD-10-CM | POA: Diagnosis not present

## 2017-02-16 DIAGNOSIS — E785 Hyperlipidemia, unspecified: Secondary | ICD-10-CM | POA: Insufficient documentation

## 2017-02-16 DIAGNOSIS — K2210 Ulcer of esophagus without bleeding: Secondary | ICD-10-CM | POA: Diagnosis not present

## 2017-02-16 DIAGNOSIS — K449 Diaphragmatic hernia without obstruction or gangrene: Secondary | ICD-10-CM | POA: Diagnosis not present

## 2017-02-16 DIAGNOSIS — K210 Gastro-esophageal reflux disease with esophagitis: Secondary | ICD-10-CM | POA: Diagnosis not present

## 2017-02-16 HISTORY — DX: Dyspnea, unspecified: R06.00

## 2017-02-16 HISTORY — PX: ESOPHAGOGASTRODUODENOSCOPY (EGD) WITH PROPOFOL: SHX5813

## 2017-02-16 SURGERY — ESOPHAGOGASTRODUODENOSCOPY (EGD) WITH PROPOFOL
Anesthesia: General

## 2017-02-16 MED ORDER — PROPOFOL 10 MG/ML IV BOLUS
INTRAVENOUS | Status: DC | PRN
  Administered 2017-02-16: 100 mg via INTRAVENOUS

## 2017-02-16 MED ORDER — IPRATROPIUM-ALBUTEROL 0.5-2.5 (3) MG/3ML IN SOLN
3.0000 mL | Freq: Once | RESPIRATORY_TRACT | Status: AC
  Administered 2017-02-16: 3 mL via RESPIRATORY_TRACT

## 2017-02-16 MED ORDER — IPRATROPIUM-ALBUTEROL 0.5-2.5 (3) MG/3ML IN SOLN
RESPIRATORY_TRACT | Status: AC
  Filled 2017-02-16: qty 3

## 2017-02-16 MED ORDER — PROPOFOL 500 MG/50ML IV EMUL
INTRAVENOUS | Status: AC
  Filled 2017-02-16: qty 50

## 2017-02-16 MED ORDER — PROPOFOL 500 MG/50ML IV EMUL
INTRAVENOUS | Status: DC | PRN
  Administered 2017-02-16: 130 ug/kg/min via INTRAVENOUS

## 2017-02-16 MED ORDER — LIDOCAINE HCL (CARDIAC) 20 MG/ML IV SOLN
INTRAVENOUS | Status: DC | PRN
  Administered 2017-02-16: 50 mg via INTRATRACHEAL

## 2017-02-16 MED ORDER — LIDOCAINE HCL (PF) 2 % IJ SOLN
INTRAMUSCULAR | Status: AC
  Filled 2017-02-16: qty 10

## 2017-02-16 MED ORDER — SODIUM CHLORIDE 0.9 % IV SOLN
INTRAVENOUS | Status: DC
  Administered 2017-02-16: 1000 mL via INTRAVENOUS

## 2017-02-16 NOTE — Anesthesia Postprocedure Evaluation (Signed)
Anesthesia Post Note  Patient: Adrieanna Ozturk  Procedure(s) Performed: ESOPHAGOGASTRODUODENOSCOPY (EGD) WITH PROPOFOL (N/A )  Patient location during evaluation: Endoscopy Anesthesia Type: General Level of consciousness: awake and alert Pain management: pain level controlled Vital Signs Assessment: post-procedure vital signs reviewed and stable Respiratory status: spontaneous breathing, nonlabored ventilation, respiratory function stable and patient connected to nasal cannula oxygen Cardiovascular status: blood pressure returned to baseline and stable Postop Assessment: no apparent nausea or vomiting Anesthetic complications: no     Last Vitals:  Vitals:   02/16/17 0940 02/16/17 0950  BP: 132/62 108/76  Pulse:  76  Resp:    Temp:    SpO2:      Last Pain:  Vitals:   02/16/17 0930  TempSrc: Tympanic                 Precious Haws Piscitello

## 2017-02-16 NOTE — Anesthesia Preprocedure Evaluation (Signed)
Anesthesia Evaluation  Patient identified by MRN, date of birth, ID band Patient awake    Reviewed: Allergy & Precautions, H&P , NPO status , Patient's Chart, lab work & pertinent test results  History of Anesthesia Complications Negative for: history of anesthetic complications  Airway Mallampati: III  TM Distance: >3 FB Neck ROM: limited    Dental  (+) Upper Dentures, Lower Dentures, Edentulous Upper, Edentulous Lower   Pulmonary neg shortness of breath, asthma ,           Cardiovascular Exercise Tolerance: Good hypertension, (-) angina(-) Past MI and (-) DOE      Neuro/Psych negative neurological ROS  negative psych ROS   GI/Hepatic negative GI ROS, Neg liver ROS, neg GERD  ,  Endo/Other  negative endocrine ROS  Renal/GU Renal disease  negative genitourinary   Musculoskeletal   Abdominal   Peds  Hematology negative hematology ROS (+)   Anesthesia Other Findings Past Medical History: No date: Dyspnea No date: Hyperlipidemia No date: Hypertension  Past Surgical History: 11/19/2016: ESOPHAGOGASTRODUODENOSCOPY (EGD) WITH PROPOFOL; N/A     Comment:  Procedure: ESOPHAGOGASTRODUODENOSCOPY (EGD) WITH               PROPOFOL;  Surgeon: [name], MD;  Location: [facility]               ENDOSCOPY;  Service: Gastroenterology;  Laterality: N/A;  BMI    Body Mass Index:  24.56 kg/m      Reproductive/Obstetrics negative OB ROS                             Anesthesia Physical Anesthesia Plan  ASA: III  Anesthesia Plan: General   Post-op Pain Management:    Induction: Intravenous  PONV Risk Score and Plan: Propofol infusion and TIVA  Airway Management Planned: Natural Airway and Nasal Cannula  Additional Equipment:   Intra-op Plan:   Post-operative Plan:   Informed Consent: I have reviewed the patients History and Physical, chart, labs and discussed the procedure including the  risks, benefits and alternatives for the proposed anesthesia with the patient or authorized representative who has indicated his/her understanding and acceptance.   Dental Advisory Given  Plan Discussed with: Anesthesiologist, CRNA and Surgeon  Anesthesia Plan Comments: (Patient consented for risks of anesthesia including but not limited to:  - adverse reactions to medications - risk of intubation if required - damage to teeth, lips or other oral mucosa - sore throat or hoarseness - Damage to heart, brain, lungs or loss of life  Patient voiced understanding.)        Anesthesia Quick Evaluation

## 2017-02-16 NOTE — Anesthesia Post-op Follow-up Note (Signed)
Anesthesia QCDR form completed.        

## 2017-02-16 NOTE — H&P (Signed)
[name], MD [address] [place], [place], [place], [id number] [address], [place], [place], [place], [address] Phone: [phone]  Fax: [phone]  Primary Care Physician:  Lavera Guise, MD   Pre-Procedure History & Physical: HPI:  Julia Davidson is a 67 y.o. female is here for an endoscopy    Past Medical History:  Diagnosis Date  . Dyspnea   . Hyperlipidemia   . Hypertension     Past Surgical History:  Procedure Laterality Date  . ESOPHAGOGASTRODUODENOSCOPY (EGD) WITH PROPOFOL N/A 11/19/2016   Procedure: ESOPHAGOGASTRODUODENOSCOPY (EGD) WITH PROPOFOL;  Surgeon: [name], MD;  Location: [facility] ENDOSCOPY;  Service: Gastroenterology;  Laterality: N/A;    Prior to Admission medications   Medication Sig Start Date End Date Taking? Authorizing Provider  amLODipine (NORVASC) 10 MG tablet Take 10 mg by mouth. 11/06/14   [provider]  aspirin (GOODSENSE ASPIRIN) 325 MG tablet Take 325 mg by mouth.    [provider]  chlorthalidone (HYGROTON) 25 MG tablet Take 25 mg daily by mouth. 10/25/16   [provider]  citalopram (CELEXA) 20 MG tablet take 1 tablet by mouth once daily WITH SUPPER FOR DEPRESSION 10/26/16   [provider]  D3-50 50000 units capsule  10/09/16   [provider]  hydrALAZINE (APRESOLINE) 25 MG tablet Take 25 mg 3 (three) times daily by mouth. 10/20/16   [provider]  losartan (COZAAR) 50 MG tablet Take 50 mg daily by mouth. 11/06/16   [provider]  omeprazole (PRILOSEC) 40 MG capsule Take 1 capsule (40 mg total) by mouth daily. 02/09/17 02/09/18  [name], MD  PROAIR HFA 108 [phone] Base) MCG/ACT inhaler take 2 puffs by mouth every 6 hours if needed 10/26/16   [provider]  simvastatin (ZOCOR) 40 MG tablet  10/25/16   [provider]  traZODone (DESYREL) 50 MG tablet Take 50 mg at bedtime by mouth. 09/08/16   [provider]    Allergies as of  01/25/2017  . (No Known Allergies)    History reviewed. No pertinent family history.  Social History   Socioeconomic History  . Marital status: Divorced    Spouse name: Not on file  . Number of children: Not on file  . Years of education: Not on file  . Highest education level: Not on file  Social Needs  . Financial resource strain: Not on file  . Food insecurity - worry: Not on file  . Food insecurity - inability: Not on file  . Transportation needs - medical: Not on file  . Transportation needs - non-medical: Not on file  Occupational History  . Not on file  Tobacco Use  . Smoking status: Never Smoker  . Smokeless tobacco: Current User    Types: Chew  Substance and Sexual Activity  . Alcohol use: Yes    Alcohol/week: 1.2 oz    Types: 1 Glasses of wine, 1 Shots of liquor per week  . Drug use: No  . Sexual activity: Yes  Other Topics Concern  . Not on file  Social History Narrative  . Not on file    Review of Systems: See HPI, otherwise negative ROS  Physical Exam: BP (!) 147/75   Pulse 65   Temp (!) 97.2 F (36.2 C) (Tympanic)   Resp 20   Ht 5\' 1"  (1.549 m)   Wt 130 lb (59 kg)   SpO2 100%   BMI 24.56 kg/m  General:  Alert,  pleasant and cooperative in NAD Head:  Normocephalic and atraumatic. Neck:  Supple; no masses or thyromegaly. Lungs:  Clear throughout to auscultation, normal respiratory effort.    Heart:  +S1, +S2, Regular rate and rhythm, No edema. Abdomen:  Soft, nontender and nondistended. Normal bowel sounds, without guarding, and without rebound.   Neurologic:  Alert and  oriented x4;  grossly normal neurologically.  Impression/Plan: Julia Davidson is here for an endoscopy  to be performed for  evaluation of a squamous papilloma , esophagitis.     Risks, benefits, limitations, and alternatives regarding endoscopy have been reviewed with the patient.  Questions have been answered.  All parties agreeable.   [name], MD  02/16/2017, 9:04  AM

## 2017-02-16 NOTE — Op Note (Signed)
[facility] Gastroenterology Patient Name: Julia Davidson Procedure Date: 02/16/2017 9:08 AM MRN: [id number] Account #: [id number] Date of Birth: 09-15-1950 Admit Type: Outpatient Age: 67 Room: [facility] ENDO ROOM 1 Gender: Female Note Status: Finalized Procedure:            Upper GI endoscopy Indications:          Follow-up of reflux esophagitis Providers:            [name] MD, MD Referring MD:         Lavera Guise, MD (Referring MD) Medicines:            Monitored Anesthesia Care Complications:        No immediate complications. Procedure:            Pre-Anesthesia Assessment:                       - Prior to the procedure, a History and Physical was                        performed, and patient medications, allergies and                        sensitivities were reviewed. The patient's tolerance of                        previous anesthesia was reviewed.                       - The risks and benefits of the procedure and the                        sedation options and risks were discussed with the                        patient. All questions were answered and informed                        consent was obtained.                       - ASA Grade Assessment: III - A patient with severe                        systemic disease.                       After obtaining informed consent, the endoscope was                        passed under direct vision. Throughout the procedure,                        the patient's blood pressure, pulse, and oxygen                        saturations were monitored continuously. The Endoscope                        was introduced through the mouth, and advanced to the  third part of duodenum. The upper GI endoscopy was                        accomplished with ease. The patient tolerated the                        procedure well. Findings:      The cardia and gastric fundus were normal on  retroflexion. Impression:           - No specimens collected. Recommendation:       - Discharge patient to home (with escort).                       - Resume previous diet.                       - Continue present medications.                       - Return to my office in 4 months. Procedure Code(s):    --- Professional ---                       [phone], Esophagogastroduodenoscopy, flexible, transoral;                        diagnostic, including collection of specimen(s) by                        brushing or washing, when performed (separate procedure) Diagnosis Code(s):    --- Professional ---                       K21.0, Gastro-esophageal reflux disease with esophagitis CPT copyright 2016 American Medical Association. All rights reserved. The codes documented in this report are preliminary and upon coder review may  be revised to meet current compliance requirements. [name], MD [name] MD, MD 02/16/2017 9:30:33 AM This report has been signed electronically. Number of Addenda: 0 Note Initiated On: 02/16/2017 9:08 AM      [facility]

## 2017-02-16 NOTE — Transfer of Care (Signed)
Immediate Anesthesia Transfer of Care Note  Patient: Elinora Renshaw  Procedure(s) Performed: ESOPHAGOGASTRODUODENOSCOPY (EGD) WITH PROPOFOL (N/A )  Patient Location: PACU and Endoscopy Unit  Anesthesia Type:General  Level of Consciousness: awake, alert , oriented and patient cooperative  Airway & Oxygen Therapy: Patient Spontanous Breathing and Patient connected to nasal cannula oxygen  Post-op Assessment: Report given to RN and Post -op Vital signs reviewed and stable  Post vital signs: Reviewed and stable  Last Vitals:  Vitals:   02/16/17 0840 02/16/17 0930  BP: (!) 147/75   Pulse: 65   Resp: 20   Temp: (!) 36.2 C (!) 36.4 C  SpO2: 100%     Last Pain:  Vitals:   02/16/17 0930  TempSrc: Tympanic         Complications: No apparent anesthesia complications

## 2017-02-17 ENCOUNTER — Encounter: Payer: Self-pay | Admitting: Gastroenterology

## 2017-03-02 ENCOUNTER — Ambulatory Visit: Payer: Self-pay | Admitting: Nurse Practitioner

## 2017-03-04 ENCOUNTER — Other Ambulatory Visit: Payer: Self-pay | Admitting: Internal Medicine

## 2017-03-19 ENCOUNTER — Encounter: Payer: Self-pay | Admitting: Gastroenterology

## 2017-03-19 ENCOUNTER — Telehealth: Payer: Self-pay | Admitting: Gastroenterology

## 2017-03-19 NOTE — Telephone Encounter (Signed)
No voice mail.  Letter sent for patient to call and schedule a 4 month follow up with Dr. Vicente Males

## 2017-05-06 ENCOUNTER — Other Ambulatory Visit: Payer: Self-pay

## 2017-05-06 ENCOUNTER — Other Ambulatory Visit: Payer: Self-pay | Admitting: Internal Medicine

## 2017-05-06 MED ORDER — SIMVASTATIN 40 MG PO TABS: ORAL_TABLET | ORAL | 2 refills | Status: DC

## 2017-05-06 MED ORDER — CHLORTHALIDONE 25 MG PO TABS: 25.0000 mg | ORAL_TABLET | Freq: Every day | ORAL | 2 refills | Status: DC

## 2017-05-12 ENCOUNTER — Other Ambulatory Visit: Payer: Self-pay

## 2017-05-12 DIAGNOSIS — K2210 Ulcer of esophagus without bleeding: Secondary | ICD-10-CM

## 2017-05-12 MED ORDER — OMEPRAZOLE 20 MG PO CPDR: 20.0000 mg | DELAYED_RELEASE_CAPSULE | Freq: Every day | ORAL | 3 refills | Status: DC

## 2017-05-19 ENCOUNTER — Other Ambulatory Visit: Payer: Self-pay | Admitting: Nurse Practitioner

## 2017-05-20 ENCOUNTER — Other Ambulatory Visit: Payer: Self-pay | Admitting: Internal Medicine

## 2017-05-20 MED ORDER — AMLODIPINE BESYLATE 10 MG PO TABS: ORAL_TABLET | ORAL | 1 refills | Status: DC

## 2017-06-10 ENCOUNTER — Ambulatory Visit: Payer: Self-pay | Admitting: Nurse Practitioner

## 2017-06-16 ENCOUNTER — Other Ambulatory Visit: Payer: Self-pay

## 2017-06-16 NOTE — Patient Outreach (Signed)
[facility]) Care Management  06/16/2017  Julia Davidson 1950-04-20 [id number]   Medication Adherence call to Julia Davidson spoke with patient she is between [place] and [place] and is going to start using mail order, she still has medication for Losartan 100 mg and Simvastatin 40 mg.  Julia Davidson is showing past due under Fellows.   [facility] Direct Dial [phone]  Fax [phone] [email or web address]

## 2017-07-02 DIAGNOSIS — N2581 Secondary hyperparathyroidism of renal origin: Secondary | ICD-10-CM | POA: Diagnosis not present

## 2017-07-02 DIAGNOSIS — E872 Acidosis: Secondary | ICD-10-CM | POA: Diagnosis not present

## 2017-07-02 DIAGNOSIS — N184 Chronic kidney disease, stage 4 (severe): Secondary | ICD-10-CM | POA: Diagnosis not present

## 2017-07-02 DIAGNOSIS — I10 Essential (primary) hypertension: Secondary | ICD-10-CM | POA: Diagnosis not present

## 2017-07-05 ENCOUNTER — Encounter: Payer: Self-pay | Admitting: Nurse Practitioner

## 2017-07-05 ENCOUNTER — Ambulatory Visit (INDEPENDENT_AMBULATORY_CARE_PROVIDER_SITE_OTHER): Payer: Medicare Other | Admitting: Nurse Practitioner

## 2017-07-05 VITALS — BP 136/67 | HR 64 | Resp 16 | Ht 61.0 in | Wt 124.4 lb

## 2017-07-05 DIAGNOSIS — B379 Candidiasis, unspecified: Secondary | ICD-10-CM

## 2017-07-05 DIAGNOSIS — N1831 Chronic kidney disease, stage 3a: Secondary | ICD-10-CM

## 2017-07-05 DIAGNOSIS — N390 Urinary tract infection, site not specified: Secondary | ICD-10-CM | POA: Diagnosis not present

## 2017-07-05 DIAGNOSIS — N183 Chronic kidney disease, stage 3 (moderate): Secondary | ICD-10-CM

## 2017-07-05 DIAGNOSIS — R300 Dysuria: Secondary | ICD-10-CM

## 2017-07-05 DIAGNOSIS — I10 Essential (primary) hypertension: Secondary | ICD-10-CM | POA: Diagnosis not present

## 2017-07-05 MED ORDER — AMOXICILLIN-POT CLAVULANATE 875-125 MG PO TABS: 1.0000 | ORAL_TABLET | Freq: Two times a day (BID) | ORAL | 0 refills | Status: DC

## 2017-07-05 MED ORDER — FLUCONAZOLE 150 MG PO TABS: ORAL_TABLET | ORAL | 0 refills | Status: DC

## 2017-07-05 NOTE — Progress Notes (Signed)
[facility] [place], [place] [address]  Internal MEDICINE  Office Visit Note  Patient Name: Julia Davidson  [id number]  [id number]  Date of Service: 07/11/2017    Pt is here for routine follow up.   Chief Complaint  Patient presents with  . Urinary Tract Infection    discharge, itching    The patient is c/o small amount of white vaginal discharge over the past few days. Does have slight odor. No pain with urination or abdominal pain reported. No fever or chills present.      Current Medication: Outpatient Encounter Medications as of 07/05/2017  Medication Sig  . amLODipine (NORVASC) 10 MG tablet Take one tablet by mouth daily  . aspirin (GOODSENSE ASPIRIN) 325 MG tablet Take 325 mg by mouth.  . chlorthalidone (HYGROTON) 25 MG tablet Take 1 tablet (25 mg total) by mouth daily.  . citalopram (CELEXA) 20 MG tablet take 1 tablet by mouth once daily WITH SUPPER FOR DEPRESSION  . D3-50 50000 units capsule   . hydrALAZINE (APRESOLINE) 25 MG tablet Take 25 mg 3 (three) times daily by mouth.  . hydrALAZINE (APRESOLINE) 50 MG tablet Take 50 mg by mouth 3 (three) times daily.  Marland Kitchen losartan (COZAAR) 50 MG tablet Take 50 mg daily by mouth.  Marland Kitchen omeprazole (PRILOSEC) 20 MG capsule Take 1 capsule (20 mg total) by mouth daily.  Marland Kitchen PROAIR HFA 108 (90 Base) MCG/ACT inhaler take 2 puffs by mouth every 6 hours if needed  . simvastatin (ZOCOR) 40 MG tablet Take 1 tab po bedtime  . sodium bicarbonate 650 MG tablet   . traZODone (DESYREL) 50 MG tablet Take 50 mg at bedtime by mouth.  . Vitamin D, Ergocalciferol, (DRISDOL) 50000 units CAPS capsule TAKE 1 CAPSULE BY MOUTH EVERY WEEK  . amoxicillin-clavulanate (AUGMENTIN) 875-125 MG tablet Take 1 tablet by mouth 2 (two) times daily.  . fluconazole (DIFLUCAN) 150 MG tablet Take 1 tablet po once. May repeat dose in 3 days as needed for persistent symptoms.   No facility-administered encounter medications on file as of 07/05/2017.      Surgical History: Past Surgical History:  Procedure Laterality Date  . ESOPHAGOGASTRODUODENOSCOPY (EGD) WITH PROPOFOL N/A 11/19/2016   Procedure: ESOPHAGOGASTRODUODENOSCOPY (EGD) WITH PROPOFOL;  Surgeon: [name], MD;  Location: [facility] ENDOSCOPY;  Service: Gastroenterology;  Laterality: N/A;  . ESOPHAGOGASTRODUODENOSCOPY (EGD) WITH PROPOFOL N/A 02/16/2017   Procedure: ESOPHAGOGASTRODUODENOSCOPY (EGD) WITH PROPOFOL;  Surgeon: [name], MD;  Location: [facility] ENDOSCOPY;  Service: Gastroenterology;  Laterality: N/A;    Medical History: Past Medical History:  Diagnosis Date  . Dyspnea   . Hyperlipidemia   . Hypertension     Family History: History reviewed. No pertinent family history.  Social History   Socioeconomic History  . Marital status: Divorced    Spouse name: Not on file  . Number of children: Not on file  . Years of education: Not on file  . Highest education level: Not on file  Occupational History  . Not on file  Social Needs  . Financial resource strain: Not on file  . Food insecurity:    Worry: Not on file    Inability: Not on file  . Transportation needs:    Medical: Not on file    Non-medical: Not on file  Tobacco Use  . Smoking status: Never Smoker  . Smokeless tobacco: Current User    Types: Chew  Substance and Sexual Activity  . Alcohol use: Yes    Alcohol/week: 1.2  oz    Types: 1 Glasses of wine, 1 Shots of liquor per week  . Drug use: No  . Sexual activity: Yes  Lifestyle  . Physical activity:    Days per week: Not on file    Minutes per session: Not on file  . Stress: Not on file  Relationships  . Social connections:    Talks on phone: Not on file    Gets together: Not on file    Attends religious service: Not on file    Active member of club or organization: Not on file    Attends meetings of clubs or organizations: Not on file    Relationship status: Not on file  . Intimate partner violence:    Fear of current or ex partner: Not  on file    Emotionally abused: Not on file    Physically abused: Not on file    Forced sexual activity: Not on file  Other Topics Concern  . Not on file  Social History Narrative  . Not on file      Review of Systems  Constitutional: Negative for chills, fatigue and unexpected weight change.  HENT: Negative for congestion, postnasal drip, rhinorrhea, sneezing and sore throat.   Eyes: Negative.  Negative for redness.  Respiratory: Negative for cough, chest tightness and shortness of breath.   Cardiovascular: Negative for chest pain and palpitations.  Gastrointestinal: Negative for abdominal pain, constipation, diarrhea, nausea and vomiting.  Endocrine: Negative for cold intolerance, heat intolerance, polydipsia, polyphagia and polyuria.  Genitourinary: Positive for dysuria and vaginal discharge. Negative for frequency.       Vaginal itching and irritation.  Musculoskeletal: Negative for arthralgias, back pain, joint swelling and neck pain.  Skin: Negative for rash.  Allergic/Immunologic: Negative for environmental allergies.  Neurological: Negative.  Negative for dizziness, tremors, numbness and headaches.  Hematological: Negative for adenopathy. Does not bruise/bleed easily.  Psychiatric/Behavioral: Negative for behavioral problems (Depression), sleep disturbance and suicidal ideas. The patient is not nervous/anxious.     Today's Vitals   07/05/17 1127  BP: 136/67  Pulse: 64  Resp: 16  SpO2: 98%  Weight: 124 lb 6.4 oz (56.4 kg)  Height: 5\' 1"  (1.549 m)    Physical Exam  Constitutional: She is oriented to person, place, and time. She appears well-developed and well-nourished. No distress.  HENT:  Head: Normocephalic and atraumatic.  Nose: Nose normal.  Mouth/Throat: Oropharynx is clear and moist. No oropharyngeal exudate.  Eyes: Pupils are equal, round, and reactive to light. Conjunctivae and EOM are normal.  Neck: Normal range of motion. Neck supple. No JVD present. No  tracheal deviation present. No thyromegaly present.  Cardiovascular: Normal rate, regular rhythm and normal heart sounds. Exam reveals no gallop and no friction rub.  No murmur heard. Pulmonary/Chest: Effort normal and breath sounds normal. No respiratory distress. She has no wheezes. She has no rales. She exhibits no tenderness.  Abdominal: Soft. Bowel sounds are normal. There is no tenderness.  Genitourinary:  Genitourinary Comments: Urine sample positive for protein and trace WBC  Musculoskeletal: Normal range of motion.  Lymphadenopathy:    She has no cervical adenopathy.  Neurological: She is alert and oriented to person, place, and time. No cranial nerve deficit.  Skin: Skin is warm and dry. She is not diaphoretic.  Psychiatric: She has a normal mood and affect. Her behavior is normal. Judgment and thought content normal.  Nursing note and vitals reviewed.  Assessment/Plan:  1. Urinary tract infection without hematuria, site  unspecified Treat with augmentin 875mg  bid for 7 days. Send urine for culture and sensitivity and adjust antibiotics as indicate.d  - amoxicillin-clavulanate (AUGMENTIN) 875-125 MG tablet; Take 1 tablet by mouth 2 (two) times daily.  Dispense: 20 tablet; Refill: 0  2. Candidiasis Treat with diflucan 150mg  once. Repeat dose in 3 days for persistent symptoms.  - fluconazole (DIFLUCAN) 150 MG tablet; Take 1 tablet po once. May repeat dose in 3 days as needed for persistent symptoms.  Dispense: 3 tablet; Refill: 0  3. Essential hypertension Stable. Continue bp medications as prescribed. Lab slip given to have routine, fasting labs checked prior to her next visit.   4. CKD stage G3a/A3, GFR 45-59 and albumin creatinine ratio >300 mg/g (HCC) Continue regular visits with Dr. Holley Raring as scheduled.  5. Dysuria - POCT Urinalysis Dipstick - CULTURE, URINE COMPREHENSIVE   General Counseling: Lauriana verbalizes understanding of the findings of todays visit and agrees  with plan of treatment. I have discussed any further diagnostic evaluation that may be needed or ordered today. We also reviewed her medications today. she has been encouraged to call the office with any questions or concerns that should arise related to todays visit.    Counseling:  This patient was seen by Leretha Pol, FNP- C in Collaboration with Dr Lavera Guise as a part of collaborative care agreement  Orders Placed This Encounter  Procedures  . CULTURE, URINE COMPREHENSIVE  . POCT Urinalysis Dipstick    Meds ordered this encounter  Medications  . amoxicillin-clavulanate (AUGMENTIN) 875-125 MG tablet    Sig: Take 1 tablet by mouth 2 (two) times daily.    Dispense:  20 tablet    Refill:  0    Order Specific Question:   Supervising Provider    Answer:   Lavera Guise [[id number]]  . fluconazole (DIFLUCAN) 150 MG tablet    Sig: Take 1 tablet po once. May repeat dose in 3 days as needed for persistent symptoms.    Dispense:  3 tablet    Refill:  0    Order Specific Question:   Supervising Provider    Answer:   Lavera Guise [[id number]]    Time spent: 9 Minutes      Dr Lavera Guise Internal medicine

## 2017-07-10 LAB — CULTURE, URINE COMPREHENSIVE

## 2017-07-15 LAB — POCT URINALYSIS DIPSTICK
Bilirubin, UA: NEGATIVE
Blood, UA: NEGATIVE
Glucose, UA: NEGATIVE
Ketones, UA: NEGATIVE
Nitrite, UA: NEGATIVE
Protein, UA: POSITIVE — AB
Spec Grav, UA: 1.015 (ref 1.010–1.025)
Urobilinogen, UA: 0.2 E.U./dL
pH, UA: 5 (ref 5.0–8.0)

## 2017-07-16 ENCOUNTER — Encounter: Payer: Self-pay | Admitting: Nurse Practitioner

## 2017-07-19 DIAGNOSIS — R809 Proteinuria, unspecified: Secondary | ICD-10-CM | POA: Diagnosis not present

## 2017-07-19 DIAGNOSIS — N184 Chronic kidney disease, stage 4 (severe): Secondary | ICD-10-CM | POA: Diagnosis not present

## 2017-07-19 DIAGNOSIS — I10 Essential (primary) hypertension: Secondary | ICD-10-CM | POA: Diagnosis not present

## 2017-08-17 ENCOUNTER — Other Ambulatory Visit: Payer: Self-pay | Admitting: Internal Medicine

## 2017-08-18 ENCOUNTER — Other Ambulatory Visit: Payer: Self-pay

## 2017-08-18 MED ORDER — AMLODIPINE BESYLATE 10 MG PO TABS: ORAL_TABLET | ORAL | 1 refills | Status: DC

## 2017-09-08 DIAGNOSIS — N184 Chronic kidney disease, stage 4 (severe): Secondary | ICD-10-CM | POA: Diagnosis not present

## 2017-09-08 DIAGNOSIS — I10 Essential (primary) hypertension: Secondary | ICD-10-CM | POA: Diagnosis not present

## 2017-09-08 DIAGNOSIS — E872 Acidosis: Secondary | ICD-10-CM | POA: Diagnosis not present

## 2017-09-08 DIAGNOSIS — N2581 Secondary hyperparathyroidism of renal origin: Secondary | ICD-10-CM | POA: Diagnosis not present

## 2017-09-09 DIAGNOSIS — I10 Essential (primary) hypertension: Secondary | ICD-10-CM | POA: Diagnosis not present

## 2017-09-09 DIAGNOSIS — N2581 Secondary hyperparathyroidism of renal origin: Secondary | ICD-10-CM | POA: Diagnosis not present

## 2017-09-09 DIAGNOSIS — N184 Chronic kidney disease, stage 4 (severe): Secondary | ICD-10-CM | POA: Diagnosis not present

## 2017-09-09 DIAGNOSIS — E872 Acidosis: Secondary | ICD-10-CM | POA: Diagnosis not present

## 2017-10-19 ENCOUNTER — Ambulatory Visit: Payer: Self-pay | Admitting: Adult Health

## 2017-10-19 DIAGNOSIS — D631 Anemia in chronic kidney disease: Secondary | ICD-10-CM | POA: Diagnosis not present

## 2017-10-19 DIAGNOSIS — I10 Essential (primary) hypertension: Secondary | ICD-10-CM | POA: Diagnosis not present

## 2017-10-19 DIAGNOSIS — E872 Acidosis: Secondary | ICD-10-CM | POA: Diagnosis not present

## 2017-10-19 DIAGNOSIS — N2581 Secondary hyperparathyroidism of renal origin: Secondary | ICD-10-CM | POA: Diagnosis not present

## 2017-10-19 DIAGNOSIS — N184 Chronic kidney disease, stage 4 (severe): Secondary | ICD-10-CM | POA: Diagnosis not present

## 2017-11-01 ENCOUNTER — Other Ambulatory Visit: Payer: Self-pay | Admitting: Nurse Practitioner

## 2017-11-01 DIAGNOSIS — E559 Vitamin D deficiency, unspecified: Secondary | ICD-10-CM | POA: Diagnosis not present

## 2017-11-01 DIAGNOSIS — E782 Mixed hyperlipidemia: Secondary | ICD-10-CM | POA: Diagnosis not present

## 2017-11-01 DIAGNOSIS — I10 Essential (primary) hypertension: Secondary | ICD-10-CM | POA: Diagnosis not present

## 2017-11-01 DIAGNOSIS — Z0001 Encounter for general adult medical examination with abnormal findings: Secondary | ICD-10-CM | POA: Diagnosis not present

## 2017-11-02 LAB — TSH: TSH: 1.33 u[IU]/mL (ref 0.450–4.500)

## 2017-11-02 LAB — COMPREHENSIVE METABOLIC PANEL
ALT: 7 IU/L (ref 0–32)
AST: 17 IU/L (ref 0–40)
Albumin/Globulin Ratio: 1.6 (ref 1.2–2.2)
Albumin: 4.6 g/dL (ref 3.6–4.8)
Alkaline Phosphatase: 69 IU/L (ref 39–117)
BUN/Creatinine Ratio: 13 (ref 12–28)
BUN: 32 mg/dL — ABNORMAL HIGH (ref 8–27)
Bilirubin Total: 0.3 mg/dL (ref 0.0–1.2)
CO2: 20 mmol/L (ref 20–29)
Calcium: 10.2 mg/dL (ref 8.7–10.3)
Chloride: 103 mmol/L (ref 96–106)
Creatinine, Ser: 2.52 mg/dL — ABNORMAL HIGH (ref 0.57–1.00)
GFR calc Af Amer: 22 mL/min/{1.73_m2} — ABNORMAL LOW (ref >59)
GFR calc non Af Amer: 19 mL/min/{1.73_m2} — ABNORMAL LOW (ref >59)
Globulin, Total: 2.8 g/dL (ref 1.5–4.5)
Glucose: 90 mg/dL (ref 65–99)
Potassium: 5.3 mmol/L — ABNORMAL HIGH (ref 3.5–5.2)
Sodium: 141 mmol/L (ref 134–144)
Total Protein: 7.4 g/dL (ref 6.0–8.5)

## 2017-11-02 LAB — CBC
Hematocrit: 39.2 % (ref 34.0–46.6)
Hemoglobin: 12.4 g/dL (ref 11.1–15.9)
MCH: 27.7 pg (ref 26.6–33.0)
MCHC: 31.6 g/dL (ref 31.5–35.7)
MCV: 88 fL (ref 79–97)
Platelets: 266 10*3/uL (ref 150–450)
RBC: 4.47 x10E6/uL (ref 3.77–5.28)
RDW: 13.1 % (ref 12.3–15.4)
WBC: 5.5 10*3/uL (ref 3.4–10.8)

## 2017-11-02 LAB — T4, FREE: Free T4: 1.26 ng/dL (ref 0.82–1.77)

## 2017-11-02 LAB — LIPID PANEL W/O CHOL/HDL RATIO
Cholesterol, Total: 234 mg/dL — ABNORMAL HIGH (ref 100–199)
HDL: 85 mg/dL (ref >39)
LDL Calculated: 131 mg/dL — ABNORMAL HIGH (ref 0–99)
Triglycerides: 92 mg/dL (ref 0–149)
VLDL Cholesterol Cal: 18 mg/dL (ref 5–40)

## 2017-11-02 LAB — VITAMIN D 25 HYDROXY (VIT D DEFICIENCY, FRACTURES): Vit D, 25-Hydroxy: 39.5 ng/mL (ref 30.0–100.0)

## 2017-11-05 ENCOUNTER — Ambulatory Visit: Payer: Medicare Other | Admitting: Adult Health

## 2017-11-05 ENCOUNTER — Encounter: Payer: Self-pay | Admitting: Adult Health

## 2017-11-05 VITALS — BP 142/88 | HR 62 | Temp 97.3°F | Resp 16 | Ht 61.0 in | Wt 125.0 lb

## 2017-11-05 DIAGNOSIS — Z0001 Encounter for general adult medical examination with abnormal findings: Secondary | ICD-10-CM

## 2017-11-05 DIAGNOSIS — R300 Dysuria: Secondary | ICD-10-CM | POA: Diagnosis not present

## 2017-11-05 DIAGNOSIS — N184 Chronic kidney disease, stage 4 (severe): Secondary | ICD-10-CM

## 2017-11-05 DIAGNOSIS — I10 Essential (primary) hypertension: Secondary | ICD-10-CM

## 2017-11-05 DIAGNOSIS — Z13820 Encounter for screening for osteoporosis: Secondary | ICD-10-CM

## 2017-11-05 DIAGNOSIS — E785 Hyperlipidemia, unspecified: Secondary | ICD-10-CM

## 2017-11-05 DIAGNOSIS — E875 Hyperkalemia: Secondary | ICD-10-CM

## 2017-11-05 MED ORDER — SODIUM POLYSTYRENE SULFONATE 15 GM/60ML PO SUSP: 15.0000 g | Freq: Two times a day (BID) | ORAL | 0 refills | Status: AC

## 2017-11-05 NOTE — Patient Instructions (Signed)
Hyperkalemia °Hyperkalemia is when you have too much potassium in your blood. Potassium is normally removed (excreted) from your body by your kidneys. If there is too much potassium in your blood, it can affect how your heart works. °Follow these instructions at home: °· Take medicines only as told by your doctor. °· Do not take any supplements, natural products, herbs, or vitamins unless your doctor says it is okay. °· Limit your alcohol intake as told by your doctor. °· Stop illegal drug use. If you need help quitting, ask your doctor. °· Keep all follow-up visits as told by your doctor. This is important. °· If you have kidney disease, you may need to follow a low potassium diet. A food specialist (dietitian) can help you. °Contact a doctor if: °· Your heartbeat is not regular or very slow. °· You feel dizzy (light-headed). °· You feel weak. °· You feel sick to your stomach (nauseous). °· You have tingling in your hands or feet. °· You cannot feel your hands or feet. °Get help right away if: °· You are short of breath. °· You have chest pain. °· You pass out (faint). °· You cannot move your muscles. °This information is not intended to replace advice given to you by your health care provider. Make sure you discuss any questions you have with your health care provider. °Document Released: 12/22/2004 Document Revised: 05/30/2015 Document Reviewed: 03/29/2013 °Elsevier Interactive Patient Education © 2018 [employer] ° °

## 2017-11-05 NOTE — Progress Notes (Signed)
[facility] [place], [place] [address]  Internal MEDICINE  Office Visit Note  Patient Name: Julia Davidson  [id number]  [id number]  Date of Service: 11/05/2017  Chief Complaint  Patient presents with  . Annual Exam    medicare wellness visit   . Hypertension  . Hyperlipidemia  . Quality Metric Gaps    bone density      HPI Pt is here for routine health maintenance examination.  She is a well-appearing 67 year old African-American female.  She has a history of hypertension, hyperlipidemia, and chronic kidney disease.  Her blood pressure is decently controlled at this time, she is slightly elevated over normal values today.  She reports she has not had her medication today and that is most likely the issue.  Patient's most recent lipid panel shows an elevated total cholesterol of 234 and an LDL cholesterol elevated at 131.  She currently taking simvastatin for hyperlipidemia.  Patient is also in need of a bone density scan, and she is agreeable to this and the order will be placed today.  She denies any general complaints at this time.  She denies tobacco or illicit drug use.  She reports that she drinks alcohol approximately 1 beer every 2 to 3 days.  Current Medication: Outpatient Encounter Medications as of 11/05/2017  Medication Sig  . amLODipine (NORVASC) 10 MG tablet TAKE 1 TABLET BY MOUTH DAILY  . aspirin (GOODSENSE ASPIRIN) 325 MG tablet Take 325 mg by mouth.  . chlorthalidone (HYGROTON) 25 MG tablet Take 1 tablet (25 mg total) by mouth daily.  . citalopram (CELEXA) 20 MG tablet take 1 tablet by mouth once daily WITH SUPPER FOR DEPRESSION  . D3-50 50000 units capsule   . hydrALAZINE (APRESOLINE) 25 MG tablet Take 25 mg 3 (three) times daily by mouth.  . hydrALAZINE (APRESOLINE) 50 MG tablet Take 50 mg by mouth 3 (three) times daily.  Marland Kitchen losartan (COZAAR) 50 MG tablet Take 50 mg daily by mouth.  Marland Kitchen omeprazole (PRILOSEC) 20 MG capsule Take 1 capsule  (20 mg total) by mouth daily.  Marland Kitchen PROAIR HFA 108 (90 Base) MCG/ACT inhaler take 2 puffs by mouth every 6 hours if needed  . simvastatin (ZOCOR) 40 MG tablet Take 1 tab po bedtime  . sodium bicarbonate 650 MG tablet   . traZODone (DESYREL) 50 MG tablet Take 50 mg at bedtime by mouth.  . Vitamin D, Ergocalciferol, (DRISDOL) 50000 units CAPS capsule TAKE 1 CAPSULE BY MOUTH EVERY WEEK  . sodium polystyrene (KAYEXALATE) 15 GM/60ML suspension Take 60 mLs (15 g total) by mouth 2 (two) times daily for 4 doses.  . [DISCONTINUED] amoxicillin-clavulanate (AUGMENTIN) 875-125 MG tablet Take 1 tablet by mouth 2 (two) times daily. (Patient not taking: Reported on 11/05/2017)  . [DISCONTINUED] fluconazole (DIFLUCAN) 150 MG tablet Take 1 tablet po once. May repeat dose in 3 days as needed for persistent symptoms. (Patient not taking: Reported on 11/05/2017)   No facility-administered encounter medications on file as of 11/05/2017.     Surgical History: Past Surgical History:  Procedure Laterality Date  . ESOPHAGOGASTRODUODENOSCOPY (EGD) WITH PROPOFOL N/A 11/19/2016   Procedure: ESOPHAGOGASTRODUODENOSCOPY (EGD) WITH PROPOFOL;  Surgeon: [name], MD;  Location: [facility] ENDOSCOPY;  Service: Gastroenterology;  Laterality: N/A;  . ESOPHAGOGASTRODUODENOSCOPY (EGD) WITH PROPOFOL N/A 02/16/2017   Procedure: ESOPHAGOGASTRODUODENOSCOPY (EGD) WITH PROPOFOL;  Surgeon: [name], MD;  Location: [facility] ENDOSCOPY;  Service: Gastroenterology;  Laterality: N/A;    Medical History: Past Medical History:  Diagnosis Date  .  Dyspnea   . Hyperlipidemia   . Hypertension     Family History: Family History  Problem Relation Age of Onset  . Congestive Heart Failure Mother       Review of Systems  Constitutional: Negative for chills, fatigue and unexpected weight change.  HENT: Negative for congestion, rhinorrhea, sneezing and sore throat.   Eyes: Negative for photophobia, pain and redness.  Respiratory: Negative for  cough, chest tightness and shortness of breath.   Cardiovascular: Negative for chest pain and palpitations.  Gastrointestinal: Negative for abdominal pain, constipation, diarrhea, nausea and vomiting.  Endocrine: Negative.   Genitourinary: Negative for dysuria and frequency.  Musculoskeletal: Negative for arthralgias, back pain, joint swelling and neck pain.  Skin: Negative for rash.  Allergic/Immunologic: Negative.   Neurological: Negative for tremors and numbness.  Hematological: Negative for adenopathy. Does not bruise/bleed easily.  Psychiatric/Behavioral: Negative for behavioral problems and sleep disturbance. The patient is not nervous/anxious.      Vital Signs: BP (!) 142/88 (BP Location: Left Arm, Patient Position: Sitting, Cuff Size: Normal)   Pulse 62   Temp (!) 97.3 F (36.3 C) (Oral)   Resp 16   Ht 5\' 1"  (1.549 m)   Wt 125 lb (56.7 kg)   SpO2 97%   BMI 23.62 kg/m    Physical Exam  Constitutional: She is oriented to person, place, and time. She appears well-developed and well-nourished. No distress.  HENT:  Head: Normocephalic and atraumatic.  Mouth/Throat: Oropharynx is clear and moist. No oropharyngeal exudate.  Eyes: Pupils are equal, round, and reactive to light. EOM are normal.  Neck: Normal range of motion. Neck supple. No JVD present. No tracheal deviation present. No thyromegaly present.  Cardiovascular: Normal rate, regular rhythm and normal heart sounds. Exam reveals no gallop and no friction rub.  No murmur heard. Pulmonary/Chest: Effort normal and breath sounds normal. No respiratory distress. She has no wheezes. She has no rales. She exhibits no tenderness. No breast swelling, tenderness, discharge or bleeding. Breasts are symmetrical.  Exam Chaperoned by Dorna Bloom CMA  Abdominal: Soft. There is no tenderness. There is no guarding.  Musculoskeletal: Normal range of motion.  Lymphadenopathy:    She has no cervical adenopathy.  Neurological: She is  alert and oriented to person, place, and time. No cranial nerve deficit.  Skin: Skin is warm and dry. She is not diaphoretic.  Psychiatric: She has a normal mood and affect. Her behavior is normal. Judgment and thought content normal.  Nursing note and vitals reviewed.    LABS: Recent Results (from the past 2160 hour(s))  Comprehensive metabolic panel     Status: Abnormal   Collection Time: 11/01/17 10:46 AM  Result Value Ref Range   Glucose 90 65 - 99 mg/dL   BUN 32 (H) 8 - 27 mg/dL   Creatinine, Ser 2.52 (H) 0.57 - 1.00 mg/dL   GFR calc non Af Amer 19 (L) >59 mL/min/1.73   GFR calc Af Amer 22 (L) >59 mL/min/1.73   BUN/Creatinine Ratio 13 12 - 28   Sodium 141 134 - 144 mmol/L   Potassium 5.3 (H) 3.5 - 5.2 mmol/L   Chloride 103 96 - 106 mmol/L   CO2 20 20 - 29 mmol/L   Calcium 10.2 8.7 - 10.3 mg/dL   Total Protein 7.4 6.0 - 8.5 g/dL   Albumin 4.6 3.6 - 4.8 g/dL   Globulin, Total 2.8 1.5 - 4.5 g/dL   Albumin/Globulin Ratio 1.6 1.2 - 2.2   Bilirubin Total 0.3  0.0 - 1.2 mg/dL   Alkaline Phosphatase 69 39 - 117 IU/L   AST 17 0 - 40 IU/L   ALT 7 0 - 32 IU/L  CBC     Status: None   Collection Time: 11/01/17 10:46 AM  Result Value Ref Range   WBC 5.5 3.4 - 10.8 x10E3/uL   RBC 4.47 3.77 - 5.28 x10E6/uL   Hemoglobin 12.4 11.1 - 15.9 g/dL   Hematocrit 39.2 34.0 - 46.6 %   MCV 88 79 - 97 fL   MCH 27.7 26.6 - 33.0 pg   MCHC 31.6 31.5 - 35.7 g/dL   RDW 13.1 12.3 - 15.4 %   Platelets 266 150 - 450 x10E3/uL  Lipid Panel w/o Chol/HDL Ratio     Status: Abnormal   Collection Time: 11/01/17 10:46 AM  Result Value Ref Range   Cholesterol, Total 234 (H) 100 - 199 mg/dL   Triglycerides 92 0 - 149 mg/dL   HDL 85 >39 mg/dL   VLDL Cholesterol Cal 18 5 - 40 mg/dL   LDL Calculated 131 (H) 0 - 99 mg/dL  T4, free     Status: None   Collection Time: 11/01/17 10:46 AM  Result Value Ref Range   Free T4 1.26 0.82 - 1.77 ng/dL  TSH     Status: None   Collection Time: 11/01/17 10:46 AM  Result  Value Ref Range   TSH 1.330 0.450 - 4.500 uIU/mL  VITAMIN D 25 Hydroxy (Vit-D Deficiency, Fractures)     Status: None   Collection Time: 11/01/17 10:46 AM  Result Value Ref Range   Vit D, 25-Hydroxy 39.5 30.0 - 100.0 ng/mL    Comment: Vitamin D deficiency has been defined by the [place] and an Endocrine Society practice guideline as a level of serum 25-OH vitamin D less than 20 ng/mL (1,2). The Endocrine Society went on to further define vitamin D insufficiency as a level between 21 and 29 ng/mL (2). 1. IOM (Institute of Medicine). 2010. Dietary reference    intakes for calcium and D. [place]: The    [employer]. 2. Holick MF, Binkley [place], Bischoff-Ferrari HA, et al.    Evaluation, treatment, and prevention of vitamin D    deficiency: an Endocrine Society clinical practice    guideline. JCEM. 2011 Jul; 96(7):1911-30.     Assessment/Plan: 1. Encounter for general adult medical examination with abnormal findings Pt is up-to-date on preventative health maintenance except for her bone density scan which is ordered today.  2. Hyperkalemia Patient's lab values show a potassium level of 5.3.  This is not surprising given her chronic kidney disease however, prescription for Kayexalate p.o. to be taken twice a day over the next few days to help alleviate some of this potassium.  Patient is supposed to follow-up with nephrology first week of December.  Will recheck labs after kayexalate. - sodium polystyrene (KAYEXALATE) 15 GM/60ML suspension; Take 60 mLs (15 g total) by mouth 2 (two) times daily for 4 doses.  Dispense: 500 mL; Refill: 0  3. Essential hypertension Patient's blood pressure mildly elevated today, however she has not had her blood pressure medications today.  Encouraged her to leave this appointment and go take her medications.  We will follow-up at next visit.  4. Hyperlipidemia, unspecified hyperlipidemia type Lipid panel shows stable  hyperlipidemia.  Patient should continue atorvastatin at this time.  5. CKD (chronic kidney disease) stage 4, GFR 15-29 ml/min [facility]) Patient sees Dr. Holley Raring in nephrology.  He is closely monitoring her CKD.  6. Screening for osteoporosis - DG DXA FRACTURE ASSESSMENT; Future  7. Dysuria - UA/M w/rflx Culture, Routine   General Counseling: Takiyah verbalizes understanding of the findings of todays visit and agrees with plan of treatment. I have discussed any further diagnostic evaluation that may be needed or ordered today. We also reviewed her medications today. she has been encouraged to call the office with any questions or concerns that should arise related to todays visit.   Orders Placed This Encounter  Procedures  . DG DXA FRACTURE ASSESSMENT  . UA/M w/rflx Culture, Routine    Meds ordered this encounter  Medications  . sodium polystyrene (KAYEXALATE) 15 GM/60ML suspension    Sig: Take 60 mLs (15 g total) by mouth 2 (two) times daily for 4 doses.    Dispense:  500 mL    Refill:  0    Time spent: 30  Minutes   This patient was seen by Orson Gear AGNP-C in Collaboration with Dr Lavera Guise as a part of collaborative care agreement   Kendell Bane AGNP-C Internal Medicine

## 2017-11-09 LAB — UA/M W/RFLX CULTURE, ROUTINE
Bilirubin, UA: NEGATIVE
Glucose, UA: NEGATIVE
Ketones, UA: NEGATIVE
Nitrite, UA: NEGATIVE
RBC, UA: NEGATIVE
Specific Gravity, UA: 1.013 (ref 1.005–1.030)
Urobilinogen, Ur: 0.2 mg/dL (ref 0.2–1.0)
pH, UA: 5.5 (ref 5.0–7.5)

## 2017-11-09 LAB — MICROSCOPIC EXAMINATION
Epithelial Cells (non renal): >10 /hpf — AB (ref 0–10)
RBC, UA: NONE SEEN /hpf (ref 0–2)
WBC, UA: >30 /hpf — AB (ref 0–5)

## 2017-11-09 LAB — URINE CULTURE, REFLEX

## 2017-11-10 ENCOUNTER — Telehealth: Payer: Self-pay

## 2017-11-10 ENCOUNTER — Other Ambulatory Visit: Payer: Self-pay | Admitting: Adult Health

## 2017-11-10 MED ORDER — SULFAMETHOXAZOLE-TRIMETHOPRIM 800-160 MG PO TABS: 1.0000 | ORAL_TABLET | Freq: Two times a day (BID) | ORAL | 0 refills | Status: DC

## 2017-11-10 NOTE — Telephone Encounter (Signed)
-----   Message from Kendell Bane, NP sent at 11/10/2017  8:47 AM EST ----- Pt has UTI.  Sent Bactrim RX to her pharmacy.

## 2017-11-10 NOTE — Telephone Encounter (Signed)
Informed pt of her results. 

## 2017-11-10 NOTE — Progress Notes (Signed)
Pt's Urine shows UTI.  Sent Bactrim to [facility] in [place].

## 2017-12-13 DIAGNOSIS — E872 Acidosis: Secondary | ICD-10-CM | POA: Diagnosis not present

## 2017-12-13 DIAGNOSIS — D631 Anemia in chronic kidney disease: Secondary | ICD-10-CM | POA: Diagnosis not present

## 2017-12-13 DIAGNOSIS — N2581 Secondary hyperparathyroidism of renal origin: Secondary | ICD-10-CM | POA: Diagnosis not present

## 2017-12-13 DIAGNOSIS — N184 Chronic kidney disease, stage 4 (severe): Secondary | ICD-10-CM | POA: Diagnosis not present

## 2017-12-13 DIAGNOSIS — I10 Essential (primary) hypertension: Secondary | ICD-10-CM | POA: Diagnosis not present

## 2018-01-07 ENCOUNTER — Other Ambulatory Visit: Payer: Self-pay

## 2018-01-07 MED ORDER — SIMVASTATIN 40 MG PO TABS: ORAL_TABLET | ORAL | 2 refills | Status: DC

## 2018-02-24 DIAGNOSIS — R809 Proteinuria, unspecified: Secondary | ICD-10-CM | POA: Diagnosis not present

## 2018-02-24 DIAGNOSIS — N184 Chronic kidney disease, stage 4 (severe): Secondary | ICD-10-CM | POA: Diagnosis not present

## 2018-02-24 DIAGNOSIS — N2581 Secondary hyperparathyroidism of renal origin: Secondary | ICD-10-CM | POA: Diagnosis not present

## 2018-02-24 DIAGNOSIS — E872 Acidosis: Secondary | ICD-10-CM | POA: Diagnosis not present

## 2018-02-24 DIAGNOSIS — I10 Essential (primary) hypertension: Secondary | ICD-10-CM | POA: Diagnosis not present

## 2018-02-24 DIAGNOSIS — D631 Anemia in chronic kidney disease: Secondary | ICD-10-CM | POA: Diagnosis not present

## 2018-03-22 ENCOUNTER — Ambulatory Visit: Payer: Medicare Other | Admitting: Gastroenterology

## 2018-03-24 ENCOUNTER — Other Ambulatory Visit: Payer: Self-pay

## 2018-03-24 MED ORDER — SIMVASTATIN 40 MG PO TABS: ORAL_TABLET | ORAL | 2 refills | Status: DC

## 2018-04-21 ENCOUNTER — Ambulatory Visit: Payer: Medicare Other | Admitting: Gastroenterology

## 2018-04-22 ENCOUNTER — Ambulatory Visit (INDEPENDENT_AMBULATORY_CARE_PROVIDER_SITE_OTHER): Payer: Medicare Other | Admitting: Gastroenterology

## 2018-04-22 DIAGNOSIS — K219 Gastro-esophageal reflux disease without esophagitis: Secondary | ICD-10-CM | POA: Diagnosis not present

## 2018-04-22 NOTE — Progress Notes (Signed)
[name] , MD [address]  [place]  [place], [place] [address]  Main: [phone]  Fax: [phone]   Primary Care Physician: Lavera Guise, MD  Virtual Visit via Telephone Note  I connected with patient on 04/22/18 at 10:00 AM EDT by telephone and verified that I am speaking with the correct person using two identifiers.   I discussed the limitations, risks, security and privacy concerns of performing an evaluation and management service by telephone and the availability of in person appointments. I also discussed with the patient that there may be a patient responsible charge related to this service. The patient expressed understanding and agreed to proceed.  Location of Patient: Home Location of Provider: Home Persons involved: Patient and provider only   History of Present Illness: Chief Complaint  Patient presents with   Follow-up    Gastritis    HPI: Linzy Hillman is a 68 y.o. female   Summary of history : She is here today to follow-up. She was initially seen  on 11/16/2016 when she was referred for an abnormal CT scan.  The CT scan performed recently described an abnormality in the distal esophagus and thickening of her proximal wall on 08/17/2016.  At that point of time she denied any issues with swallowing, gained and lost some weight.  History of chewing tobacco. She underwent an upper endoscopy on 11/19/2016  I noted a 4 cm hiatal hernia.  Congestion erythema was noted in the duodenal bulb.  Biopsies were taken again I did note blood erosions erythema and aphthous ulcerations was formed in the gastric antrum.  Biopsies were taken.  The Z line was irregular biopsies were taken.  A 7 mm polyp with no bleeding was found of the 20 cm mark from the incisors and the esophagus.  The polyp was completely removed with a cold biopsy forceps.  Biopsies confirmed duodenitis.  Gastric biopsies demonstrated gastritis.  Esophageal biopsies showed acute esophagitis  with ulcers which were taken of the GE junction.  And there was a polyp in the esophagus was returned back as a squamous papilloma which is benign.  Interval history 12/21/2016-04/22/2018  02/16/2017 : EGD: Normal .   Doing well no issues, pn prilosec 40 mg once daily no heartburn.     Current Outpatient Medications  Medication Sig Dispense Refill   amLODipine (NORVASC) 10 MG tablet TAKE 1 TABLET BY MOUTH DAILY 90 tablet 1   carvedilol (COREG) 6.25 MG tablet      hydrALAZINE (APRESOLINE) 50 MG tablet Take 50 mg by mouth 3 (three) times daily.  1   losartan (COZAAR) 50 MG tablet Take 50 mg daily by mouth.  0   omeprazole (PRILOSEC) 40 MG capsule Take 40 mg by mouth daily.     PROAIR HFA 108 (90 Base) MCG/ACT inhaler take 2 puffs by mouth every 6 hours if needed  0   simvastatin (ZOCOR) 40 MG tablet Take 1 tab po bedtime 30 tablet 2   sodium bicarbonate 650 MG tablet   1   traZODone (DESYREL) 50 MG tablet Take 50 mg at bedtime by mouth.  0   Vitamin D, Ergocalciferol, (DRISDOL) 50000 units CAPS capsule TAKE 1 CAPSULE BY MOUTH EVERY WEEK 4 capsule 0   aspirin (GOODSENSE ASPIRIN) 325 MG tablet Take 325 mg by mouth.     chlorthalidone (HYGROTON) 25 MG tablet Take 1 tablet (25 mg total) by mouth daily. (Patient not taking: Reported on 04/22/2018) 30 tablet 2  citalopram (CELEXA) 20 MG tablet take 1 tablet by mouth once daily WITH SUPPER FOR DEPRESSION  0   D3-50 50000 units capsule   0   hydrALAZINE (APRESOLINE) 25 MG tablet Take 25 mg 3 (three) times daily by mouth.  0   omeprazole (PRILOSEC) 20 MG capsule Take 1 capsule (20 mg total) by mouth daily. (Patient not taking: Reported on 04/22/2018) 90 capsule 3   sulfamethoxazole-trimethoprim (BACTRIM DS,SEPTRA DS) 800-160 MG tablet Take 1 tablet by mouth 2 (two) times daily. (Patient not taking: Reported on 04/22/2018) 10 tablet 0   No current facility-administered medications for this visit.     Allergies as of 04/22/2018    (No Known Allergies)    Review of Systems:    All systems reviewed and negative except where noted in HPI.   Observations/Objective:  Labs: CMP     Component Value Date/Time   NA 141 11/01/2017 1046   K 5.3 (H) 11/01/2017 1046   CL 103 11/01/2017 1046   CO2 20 11/01/2017 1046   GLUCOSE 90 11/01/2017 1046   BUN 32 (H) 11/01/2017 1046   CREATININE 2.52 (H) 11/01/2017 1046   CALCIUM 10.2 11/01/2017 1046   PROT 7.4 11/01/2017 1046   ALBUMIN 4.6 11/01/2017 1046   AST 17 11/01/2017 1046   ALT 7 11/01/2017 1046   ALKPHOS 69 11/01/2017 1046   BILITOT 0.3 11/01/2017 1046   GFRNONAA 19 (L) 11/01/2017 1046   GFRAA 22 (L) 11/01/2017 1046   Lab Results  Component Value Date   WBC 5.5 11/01/2017   HGB 12.4 11/01/2017   HCT 39.2 11/01/2017   MCV 88 11/01/2017   PLT 266 11/01/2017    Imaging Studies: No results found.  Assessment and Plan:   Meline Feutz is a 68 y.o. y/o female here for follow up . In 2018 endoscopy demonstrated esophagitis, esophageal ulcers and  squamous papilloma which was benign was completely excised.Reepat EGD was normal. Doing well for her reflux. Stable on prilosec 40 mg once a day, offered to drop dose to 20 mg but she stated that 24 works well.   F/u as needed    I discussed the assessment and treatment plan with the patient. The patient was provided an opportunity to ask questions and all were answered. The patient agreed with the plan and demonstrated an understanding of the instructions.   The patient was advised to call back or seek an in-person evaluation if the symptoms worsen or if the condition fails to improve as anticipated.  I provided 11  minutes of non-face-to-face time during this encounter.  Dr [name] MD,MRCP [facility]) Gastroenterology/Hepatology Pager: [phone]   Speech recognition software was used to dictate this note.

## 2018-04-27 ENCOUNTER — Encounter: Payer: Self-pay | Admitting: Nurse Practitioner

## 2018-05-12 DIAGNOSIS — N2581 Secondary hyperparathyroidism of renal origin: Secondary | ICD-10-CM | POA: Diagnosis not present

## 2018-05-12 DIAGNOSIS — I10 Essential (primary) hypertension: Secondary | ICD-10-CM | POA: Diagnosis not present

## 2018-05-12 DIAGNOSIS — E872 Acidosis: Secondary | ICD-10-CM | POA: Diagnosis not present

## 2018-05-12 DIAGNOSIS — N184 Chronic kidney disease, stage 4 (severe): Secondary | ICD-10-CM | POA: Diagnosis not present

## 2018-05-12 DIAGNOSIS — D631 Anemia in chronic kidney disease: Secondary | ICD-10-CM | POA: Diagnosis not present

## 2018-06-09 ENCOUNTER — Ambulatory Visit: Payer: Medicare Other | Admitting: Nurse Practitioner

## 2018-06-16 ENCOUNTER — Ambulatory Visit: Payer: Medicare Other | Admitting: Nurse Practitioner

## 2018-06-16 ENCOUNTER — Other Ambulatory Visit: Payer: Self-pay

## 2018-06-20 NOTE — Progress Notes (Signed)
The patient was not seen.   

## 2018-06-30 DIAGNOSIS — I10 Essential (primary) hypertension: Secondary | ICD-10-CM | POA: Diagnosis not present

## 2018-06-30 DIAGNOSIS — N2581 Secondary hyperparathyroidism of renal origin: Secondary | ICD-10-CM | POA: Diagnosis not present

## 2018-06-30 DIAGNOSIS — N184 Chronic kidney disease, stage 4 (severe): Secondary | ICD-10-CM | POA: Diagnosis not present

## 2018-06-30 DIAGNOSIS — R809 Proteinuria, unspecified: Secondary | ICD-10-CM | POA: Diagnosis not present

## 2018-06-30 DIAGNOSIS — D631 Anemia in chronic kidney disease: Secondary | ICD-10-CM | POA: Diagnosis not present

## 2018-07-04 ENCOUNTER — Other Ambulatory Visit: Payer: Self-pay

## 2018-07-04 MED ORDER — OMEPRAZOLE 40 MG PO CPDR: 40.0000 mg | DELAYED_RELEASE_CAPSULE | Freq: Every day | ORAL | 0 refills | Status: DC

## 2018-07-05 ENCOUNTER — Telehealth: Payer: Self-pay | Admitting: Gastroenterology

## 2018-07-05 ENCOUNTER — Other Ambulatory Visit: Payer: Self-pay

## 2018-07-05 NOTE — Telephone Encounter (Signed)
Pt is calling  To inform nurse of mg  on rx  omeorazole  40 mg [facility] in [place]

## 2018-08-05 ENCOUNTER — Other Ambulatory Visit: Payer: Self-pay | Admitting: Nurse Practitioner

## 2018-08-05 MED ORDER — AMLODIPINE BESYLATE 10 MG PO TABS: ORAL_TABLET | ORAL | 0 refills | Status: DC

## 2018-08-15 ENCOUNTER — Other Ambulatory Visit: Payer: Self-pay

## 2018-08-15 NOTE — Patient Outreach (Signed)
[facility]) Care Management  08/15/2018  Julia Davidson 27-Jun-1950 [id number]  Medication Adherence call to Mrs. Julia Davidson Hipp[place] Identifiers Verify spoke with patient she is past due on Simvastatin 40 mg patient explain she has not been taking this medication the way she is suppose to take them but lately she has started taking it once daily. Julia Davidson is showing past due under [place].   [facility] Management Direct Dial [phone]  Fax [phone] [email or web address]

## 2018-08-25 DIAGNOSIS — R809 Proteinuria, unspecified: Secondary | ICD-10-CM | POA: Diagnosis not present

## 2018-08-25 DIAGNOSIS — D631 Anemia in chronic kidney disease: Secondary | ICD-10-CM | POA: Diagnosis not present

## 2018-08-25 DIAGNOSIS — I10 Essential (primary) hypertension: Secondary | ICD-10-CM | POA: Diagnosis not present

## 2018-08-25 DIAGNOSIS — N184 Chronic kidney disease, stage 4 (severe): Secondary | ICD-10-CM | POA: Diagnosis not present

## 2018-08-25 DIAGNOSIS — N2581 Secondary hyperparathyroidism of renal origin: Secondary | ICD-10-CM | POA: Diagnosis not present

## 2018-09-22 ENCOUNTER — Encounter: Payer: Self-pay | Admitting: Nurse Practitioner

## 2018-09-22 ENCOUNTER — Other Ambulatory Visit: Payer: Self-pay

## 2018-09-22 ENCOUNTER — Ambulatory Visit (INDEPENDENT_AMBULATORY_CARE_PROVIDER_SITE_OTHER): Payer: Medicare Other | Admitting: Nurse Practitioner

## 2018-09-22 VITALS — BP 130/66 | HR 64 | Temp 97.6°F | Resp 16 | Ht 60.0 in | Wt 129.4 lb

## 2018-09-22 DIAGNOSIS — R55 Syncope and collapse: Secondary | ICD-10-CM

## 2018-09-22 DIAGNOSIS — R0989 Other specified symptoms and signs involving the circulatory and respiratory systems: Secondary | ICD-10-CM

## 2018-09-22 DIAGNOSIS — I10 Essential (primary) hypertension: Secondary | ICD-10-CM | POA: Diagnosis not present

## 2018-09-22 NOTE — Progress Notes (Signed)
[facility] [place], [place] [address]  Internal MEDICINE  Office Visit Note  Patient Name: Julia Davidson  [id number]  [id number]  Date of Service: 10/02/2018   Pt is here for a sick visit.  Chief Complaint  Patient presents with  . Dizziness    one week ago it was bad, sitting down one day and got very dizzy and pt passed out for a little bit, at nighttime would get veery dizzy but has gotten better     The patient states that she has had several dizzy spells over the past few weeks. Initially, they were happening at night when she would wake up to use the bathroom. Would last for a few minutes then resolve. This has gotten better. Then Monday she had episode at the dinner table, talking to family members. States that she blacked out while sitting at the table. When she came too, she got very sick to her stomach and vomited. She states that felt better immediately after she vomited. She denies headaches, episodes of high blood pressure. She denies chest pain, chest pressure, or any shortness of breath.        Current Medication:  Outpatient Encounter Medications as of 09/22/2018  Medication Sig  . amLODipine (NORVASC) 10 MG tablet TAKE 1 TABLET BY MOUTH DAILY  . carvedilol (COREG) 6.25 MG tablet Take 6.25 mg by mouth 2 (two) times daily with a meal.   . citalopram (CELEXA) 20 MG tablet take 1 tablet by mouth once daily WITH SUPPER FOR DEPRESSION  . hydrALAZINE (APRESOLINE) 50 MG tablet Take 50 mg by mouth 3 (three) times daily.  Marland Kitchen losartan (COZAAR) 100 MG tablet Take 100 mg by mouth daily.   Marland Kitchen omeprazole (PRILOSEC) 20 MG capsule Take 1 capsule (20 mg total) by mouth daily.  Marland Kitchen PROAIR HFA 108 (90 Base) MCG/ACT inhaler take 2 puffs by mouth every 6 hours if needed  . simvastatin (ZOCOR) 40 MG tablet Take 1 tab po bedtime  . sodium bicarbonate 650 MG tablet Take 650 mg by mouth 2 (two) times daily.   . traZODone (DESYREL) 50 MG tablet Take 50 mg at  bedtime by mouth.  . [DISCONTINUED] aspirin (GOODSENSE ASPIRIN) 325 MG tablet Take 325 mg by mouth.  . [DISCONTINUED] chlorthalidone (HYGROTON) 25 MG tablet Take 1 tablet (25 mg total) by mouth daily. (Patient not taking: Reported on 04/22/2018)  . [DISCONTINUED] D3-50 50000 units capsule   . [DISCONTINUED] hydrALAZINE (APRESOLINE) 25 MG tablet Take 25 mg 3 (three) times daily by mouth.  . [DISCONTINUED] omeprazole (PRILOSEC) 40 MG capsule Take 1 capsule (40 mg total) by mouth daily. (Patient not taking: Reported on 09/22/2018)  . [DISCONTINUED] sulfamethoxazole-trimethoprim (BACTRIM DS,SEPTRA DS) 800-160 MG tablet Take 1 tablet by mouth 2 (two) times daily. (Patient not taking: Reported on 04/22/2018)  . [DISCONTINUED] Vitamin D, Ergocalciferol, (DRISDOL) 50000 units CAPS capsule TAKE 1 CAPSULE BY MOUTH EVERY WEEK (Patient not taking: Reported on 09/22/2018)   No facility-administered encounter medications on file as of 09/22/2018.       Medical History: Past Medical History:  Diagnosis Date  . Dyspnea   . Hyperlipidemia   . Hypertension      Today's Vitals   09/22/18 1117  BP: 130/66  Pulse: 64  Resp: 16  Temp: 97.6 F (36.4 C)  SpO2: 99%  Weight: 129 lb 6.4 oz (58.7 kg)  Height: 5' (1.524 m)   Body mass index is 25.27 kg/m.  Review of Systems  Constitutional:  Positive for fatigue. Negative for chills and unexpected weight change.  HENT: Negative for congestion, ear discharge, ear pain, postnasal drip, rhinorrhea, sneezing and sore throat.   Respiratory: Negative for cough, chest tightness, shortness of breath and wheezing.   Cardiovascular: Negative for chest pain and palpitations.  Gastrointestinal: Positive for nausea and vomiting. Negative for abdominal pain, constipation and diarrhea.  Endocrine: Negative for cold intolerance, heat intolerance, polydipsia and polyuria.  Musculoskeletal: Negative for arthralgias, back pain, joint swelling and neck pain.  Skin: Negative  for rash.  Neurological: Positive for dizziness, weakness and headaches. Negative for tremors and numbness.  Hematological: Negative for adenopathy. Does not bruise/bleed easily.  Psychiatric/Behavioral: Negative for behavioral problems (Depression), sleep disturbance and suicidal ideas. The patient is not nervous/anxious.     Physical Exam Vitals signs and nursing note reviewed.  Constitutional:      General: She is not in acute distress.    Appearance: Normal appearance. She is well-developed. She is not diaphoretic.  HENT:     Head: Normocephalic and atraumatic.     Mouth/Throat:     Pharynx: No oropharyngeal exudate.  Eyes:     Extraocular Movements: Extraocular movements intact.     Pupils: Pupils are equal, round, and reactive to light.  Neck:     Musculoskeletal: Normal range of motion and neck supple.     Thyroid: No thyromegaly.     Vascular: No JVD.     Trachea: No tracheal deviation.     Comments: Soft carotid bruit auscultated bilaterally Cardiovascular:     Rate and Rhythm: Normal rate and regular rhythm.     Heart sounds: Normal heart sounds. No murmur. No friction rub. No gallop.   Pulmonary:     Effort: Pulmonary effort is normal. No respiratory distress.     Breath sounds: Normal breath sounds. No wheezing or rales.  Chest:     Chest wall: No tenderness.  Abdominal:     General: Bowel sounds are normal.     Palpations: Abdomen is soft.     Tenderness: There is no abdominal tenderness.  Musculoskeletal: Normal range of motion.  Lymphadenopathy:     Cervical: No cervical adenopathy.  Skin:    General: Skin is warm and dry.  Neurological:     General: No focal deficit present.     Mental Status: She is alert and oriented to person, place, and time.     Cranial Nerves: No cranial nerve deficit.     Comments: Pupils are equal, round, and reactive to light and accomodation. Cranial nerves are grossly intact. Extremity strength is equal bilaterally in upper and  lower extremities.   Psychiatric:        Behavior: Behavior normal.        Thought Content: Thought content normal.        Judgment: Judgment normal.   Assessment/Plan: 1. Bilateral carotid bruits Will get carotid doppler study for further evaluation.  - US Carotid Bilateral; Future  2. Syncope and collapse Will get CT head and carotid doppler for further evaluatin.  - US Carotid Bilateral; Future - CT Head Wo Contrast; Future  3. Essential hypertension Stable. Continue bp medication as prescribed   General Counseling: Chester verbalizes understanding of the findings of todays visit and agrees with plan of treatment. I have discussed any further diagnostic evaluation that may be needed or ordered today. We also reviewed her medications today. she has been encouraged to call the office with any questions or concerns that should  arise related to todays visit.    Counseling:  This patient was seen by Leretha Pol FNP Collaboration with Dr Lavera Guise as a part of collaborative care agreement  Orders Placed This Encounter  Procedures  . US Carotid Bilateral  . CT Head Wo Contrast      Time spent: 25 Minutes

## 2018-09-27 ENCOUNTER — Ambulatory Visit: Admission: RE | Admit: 2018-09-27 | Payer: Medicaid Other | Source: Ambulatory Visit

## 2018-09-30 ENCOUNTER — Other Ambulatory Visit: Payer: Self-pay

## 2018-09-30 ENCOUNTER — Ambulatory Visit (INDEPENDENT_AMBULATORY_CARE_PROVIDER_SITE_OTHER): Payer: Medicare Other

## 2018-09-30 DIAGNOSIS — R0989 Other specified symptoms and signs involving the circulatory and respiratory systems: Secondary | ICD-10-CM | POA: Diagnosis not present

## 2018-09-30 DIAGNOSIS — R55 Syncope and collapse: Secondary | ICD-10-CM

## 2018-10-02 DIAGNOSIS — R55 Syncope and collapse: Secondary | ICD-10-CM | POA: Insufficient documentation

## 2018-10-02 DIAGNOSIS — R0989 Other specified symptoms and signs involving the circulatory and respiratory systems: Secondary | ICD-10-CM | POA: Insufficient documentation

## 2018-10-04 NOTE — Procedures (Signed)
[place], [place] [address]  DATE OF SERVICE: September 30, 2018  CAROTID DOPPLER INTERPRETATION:  Bilateral Carotid Ultrsasound and Color Doppler Examination was performed. The RIGHT CCA shows mild plaque in the vessel. The LEFT CCA shows mild plaque in the vessel. There was mild intimal thickening noted in the RIGHT carotid artery. There was mild intimal thickening in the LEFT carotid artery.  The RIGHT CCA shows peak systolic velocity of 83 cm per second. The end diastolic velocity is 13 cm per second on the RIGHT side. The RIGHT ICA shows peak systolic velocity of 86 per second. RIGHT sided ICA end diastolic velocity is 19 cm per second. The RIGHT ECA shows a peak systolic velocity of 76 cm per second. The ICA/CCA ratio is calculated to be 1.0. This suggests less than 50% stenosis. The Vertebral Artery shows antegrade flow.  The LEFT CCA shows peak systolic velocity of 51 cm per second. The end diastolic velocity is 14 cm per second on the LEFT side. The LEFT ICA shows peak systolic velocity of 99 per second. LEFT sided ICA end diastolic velocity is 34 cm per second. The LEFT ECA shows a peak systolic velocity of 98 cm per second. The ICA/CCA ratio is calculated to be 1.49. This suggests less than 50% stenosis. The Vertebral Artery shows antegrade flow.   Impression:    The RIGHT CAROTID shows less than 50% stenosis. The LEFT CAROTID shows less than 50% stenosis.  There is mild plaque formation noted on the LEFT and mild plaque on the RIGHT  side.  Bilateral thyroid cysts are also noted clinical correlation is recommended.  Consider a repeat Carotid doppler if clinical situation and symptoms warrant in 6-12 months. Patient should be encouraged to change lifestyles such as smoking cessation, regular exercise and dietary modification. Use of statins in the right clinical setting and ASA is encouraged.  Allyne Gee, MD [facility] Pulmonary Critical Care  Medicine

## 2018-10-10 ENCOUNTER — Ambulatory Visit: Payer: Medicare Other | Admitting: Nurse Practitioner

## 2018-10-13 ENCOUNTER — Ambulatory Visit: Payer: Medicare Other

## 2018-10-17 ENCOUNTER — Other Ambulatory Visit: Payer: Self-pay

## 2018-10-17 ENCOUNTER — Encounter: Payer: Self-pay | Admitting: Adult Health

## 2018-10-17 ENCOUNTER — Ambulatory Visit (INDEPENDENT_AMBULATORY_CARE_PROVIDER_SITE_OTHER): Payer: Medicare Other | Admitting: Adult Health

## 2018-10-17 VITALS — BP 160/86 | HR 82 | Temp 97.0°F | Resp 16 | Ht 61.0 in | Wt 137.0 lb

## 2018-10-17 DIAGNOSIS — A599 Trichomoniasis, unspecified: Secondary | ICD-10-CM

## 2018-10-17 DIAGNOSIS — I10 Essential (primary) hypertension: Secondary | ICD-10-CM | POA: Diagnosis not present

## 2018-10-17 DIAGNOSIS — Z202 Contact with and (suspected) exposure to infections with a predominantly sexual mode of transmission: Secondary | ICD-10-CM | POA: Diagnosis not present

## 2018-10-17 MED ORDER — METRONIDAZOLE 500 MG PO TABS: 2000.0000 mg | ORAL_TABLET | Freq: Once | ORAL | 0 refills | Status: AC

## 2018-10-17 NOTE — Progress Notes (Signed)
[facility] [place], [place] [address]  Internal MEDICINE  Office Visit Note  Patient Name: Julia Davidson  [id number]  [id number]  Date of Service: 10/17/2018  Chief Complaint  Patient presents with  . Follow-up    Std check  . Hypertension  . Hyperlipidemia     HPI Pt is here for a sick visit. Her boyfriends urologist informed him today that he tested positive for Trichomonous. He has picked up the RX, and she would like to be checked for the same.  She denies any symptoms.  No vaginal discharge, itching, burning or other symptoms.      Current Medication:  Outpatient Encounter Medications as of 10/17/2018  Medication Sig  . amLODipine (NORVASC) 10 MG tablet TAKE 1 TABLET BY MOUTH DAILY  . carvedilol (COREG) 6.25 MG tablet Take 6.25 mg by mouth 2 (two) times daily with a meal.   . citalopram (CELEXA) 20 MG tablet take 1 tablet by mouth once daily WITH SUPPER FOR DEPRESSION  . hydrALAZINE (APRESOLINE) 50 MG tablet Take 50 mg by mouth 3 (three) times daily.  Marland Kitchen losartan (COZAAR) 100 MG tablet Take 100 mg by mouth daily.   . metroNIDAZOLE (FLAGYL) 500 MG tablet Take 4 tablets (2,000 mg total) by mouth once for 1 dose.  Marland Kitchen omeprazole (PRILOSEC) 20 MG capsule Take 1 capsule (20 mg total) by mouth daily.  Marland Kitchen PROAIR HFA 108 (90 Base) MCG/ACT inhaler take 2 puffs by mouth every 6 hours if needed  . simvastatin (ZOCOR) 40 MG tablet Take 1 tab po bedtime  . sodium bicarbonate 650 MG tablet Take 650 mg by mouth 2 (two) times daily.   . traZODone (DESYREL) 50 MG tablet Take 50 mg at bedtime by mouth.   No facility-administered encounter medications on file as of 10/17/2018.       Medical History: Past Medical History:  Diagnosis Date  . Dyspnea   . Hyperlipidemia   . Hypertension      Vital Signs: BP (!) 160/86   Pulse 82   Temp (!) 97 F (36.1 C)   Resp 16   Ht 5\' 1"  (1.549 m)   Wt 137 lb (62.1 kg)   SpO2 96%   BMI 25.89 kg/m     Review of Systems  Constitutional: Negative for chills, fatigue and unexpected weight change.  HENT: Negative for congestion, rhinorrhea, sneezing and sore throat.   Eyes: Negative for photophobia, pain and redness.  Respiratory: Negative for cough, chest tightness and shortness of breath.   Cardiovascular: Negative for chest pain and palpitations.  Gastrointestinal: Negative for abdominal pain, constipation, diarrhea, nausea and vomiting.  Endocrine: Negative.   Genitourinary: Negative for dysuria and frequency.  Musculoskeletal: Negative for arthralgias, back pain, joint swelling and neck pain.  Skin: Negative for rash.  Allergic/Immunologic: Negative.   Neurological: Negative for tremors and numbness.  Hematological: Negative for adenopathy. Does not bruise/bleed easily.  Psychiatric/Behavioral: Negative for behavioral problems and sleep disturbance. The patient is not nervous/anxious.     Physical Exam Vitals signs and nursing note reviewed.  Constitutional:      General: She is not in acute distress.    Appearance: She is well-developed. She is not diaphoretic.  HENT:     Head: Normocephalic and atraumatic.     Mouth/Throat:     Pharynx: No oropharyngeal exudate.  Eyes:     Pupils: Pupils are equal, round, and reactive to light.  Neck:     Musculoskeletal: Normal range of  motion and neck supple.     Thyroid: No thyromegaly.     Vascular: No JVD.     Trachea: No tracheal deviation.  Cardiovascular:     Rate and Rhythm: Normal rate and regular rhythm.     Heart sounds: Normal heart sounds. No murmur. No friction rub. No gallop.   Pulmonary:     Effort: Pulmonary effort is normal. No respiratory distress.     Breath sounds: Normal breath sounds. No wheezing or rales.  Chest:     Chest wall: No tenderness.  Abdominal:     Palpations: Abdomen is soft.     Tenderness: There is no abdominal tenderness. There is no guarding.  Musculoskeletal: Normal range of motion.   Lymphadenopathy:     Cervical: No cervical adenopathy.  Skin:    General: Skin is warm and dry.  Neurological:     Mental Status: She is alert and oriented to person, place, and time.     Cranial Nerves: No cranial nerve deficit.  Psychiatric:        Behavior: Behavior normal.        Thought Content: Thought content normal.        Judgment: Judgment normal.     Assessment/Plan: 1. Exposure to STD Discussed with patient that we did not need to test her for pathogens, that her exposure was enough for treatment.  We did discuss the need for ongoing testing for other infections.  They have been sexually active together for 3 years.   - metroNIDAZOLE (FLAGYL) 500 MG tablet; Take 4 tablets (2,000 mg total) by mouth once for 1 dose.  Dispense: 4 tablet; Refill: 0  2. Trichomonosis Treatment as above, for positive exposure.  3. Essential hypertension Slightly elevated, pt is minimally compliant with bp meds. I counseled her on the importance of medication complaint again at this visit.   General Counseling: Julia Davidson verbalizes understanding of the findings of todays visit and agrees with plan of treatment. I have discussed any further diagnostic evaluation that may be needed or ordered today. We also reviewed her medications today. she has been encouraged to call the office with any questions or concerns that should arise related to todays visit.   Orders Placed This Encounter  Procedures  . Chlamydia/Gonococcus/Trichomonas, NAA    Meds ordered this encounter  Medications  . metroNIDAZOLE (FLAGYL) 500 MG tablet    Sig: Take 4 tablets (2,000 mg total) by mouth once for 1 dose.    Dispense:  4 tablet    Refill:  0    Time spent: 25 Minutes  This patient was seen by Orson Gear AGNP-C in Collaboration with Dr Lavera Guise as a part of collaborative care agreement.  Kendell Bane AGNP-C Internal Medicine

## 2018-10-20 ENCOUNTER — Other Ambulatory Visit: Payer: Self-pay

## 2018-10-20 ENCOUNTER — Ambulatory Visit
Admission: RE | Admit: 2018-10-20 | Discharge: 2018-10-20 | Disposition: A | Discharge status: Home / Self Care | Payer: Medicare Other | Source: Ambulatory Visit | Attending: Nurse Practitioner | Admitting: Nurse Practitioner

## 2018-10-20 DIAGNOSIS — R55 Syncope and collapse: Secondary | ICD-10-CM | POA: Diagnosis not present

## 2018-10-20 NOTE — Progress Notes (Signed)
Mild plaque with <50% stenosis, bilaterally. Discuss with patient at visit 10/24/2018

## 2018-10-20 NOTE — Progress Notes (Signed)
Ole basilar infarct present. Microvascular changes. Review with patient at visit 10/24/2018

## 2018-10-24 ENCOUNTER — Other Ambulatory Visit: Payer: Self-pay

## 2018-10-24 ENCOUNTER — Ambulatory Visit (INDEPENDENT_AMBULATORY_CARE_PROVIDER_SITE_OTHER): Payer: Medicare Other | Admitting: Nurse Practitioner

## 2018-10-24 ENCOUNTER — Encounter: Payer: Self-pay | Admitting: Nurse Practitioner

## 2018-10-24 ENCOUNTER — Other Ambulatory Visit: Payer: Self-pay | Admitting: Nurse Practitioner

## 2018-10-24 VITALS — BP 122/63 | HR 68 | Temp 97.3°F | Resp 16 | Ht 61.0 in | Wt 127.0 lb

## 2018-10-24 DIAGNOSIS — R55 Syncope and collapse: Secondary | ICD-10-CM

## 2018-10-24 DIAGNOSIS — R0989 Other specified symptoms and signs involving the circulatory and respiratory systems: Secondary | ICD-10-CM | POA: Diagnosis not present

## 2018-10-24 DIAGNOSIS — I679 Cerebrovascular disease, unspecified: Secondary | ICD-10-CM

## 2018-10-24 MED ORDER — AMLODIPINE BESYLATE 10 MG PO TABS: ORAL_TABLET | ORAL | 0 refills | Status: DC

## 2018-10-24 NOTE — Progress Notes (Signed)
[facility] [place], [place] [address]  Internal MEDICINE  Office Visit Note  Patient Name: Julia Davidson  [id number]  [id number]  Date of Service: 10/24/2018  Chief Complaint  Patient presents with  . Follow-up    carotid ultrasound and ct results    The patient is here for follow up carotid doppler and CT scan results. She had been having some dizzy spells prior to her last visit with me. One spell actually led to her losing consciousness. She does have history of lacunar infarct and had been worried that she was having worsening cerebrovascular disease. Her carotid doppler did show mild plaque with <50% stenosis, bilaterally. Her CT scan showed the old lacunar infarct with microvascular ischemic changes. There were no acute findings at this time. She states that she has had no further eisodes of dizziness or syncope since her most recent visit.       Current Medication: Outpatient Encounter Medications as of 10/24/2018  Medication Sig  . amLODipine (NORVASC) 10 MG tablet TAKE 1 TABLET BY MOUTH DAILY  . carvedilol (COREG) 6.25 MG tablet Take 6.25 mg by mouth 2 (two) times daily with a meal.   . citalopram (CELEXA) 20 MG tablet take 1 tablet by mouth once daily WITH SUPPER FOR DEPRESSION  . hydrALAZINE (APRESOLINE) 50 MG tablet Take 50 mg by mouth 3 (three) times daily.  Marland Kitchen losartan (COZAAR) 100 MG tablet Take 100 mg by mouth daily.   Marland Kitchen omeprazole (PRILOSEC) 20 MG capsule Take 1 capsule (20 mg total) by mouth daily.  Marland Kitchen PROAIR HFA 108 (90 Base) MCG/ACT inhaler take 2 puffs by mouth every 6 hours if needed  . simvastatin (ZOCOR) 40 MG tablet Take 1 tab po bedtime  . sodium bicarbonate 650 MG tablet Take 650 mg by mouth 2 (two) times daily.   . traZODone (DESYREL) 50 MG tablet Take 50 mg at bedtime by mouth.   No facility-administered encounter medications on file as of 10/24/2018.     Surgical History: Past Surgical History:  Procedure Laterality  Date  . ESOPHAGOGASTRODUODENOSCOPY (EGD) WITH PROPOFOL N/A 11/19/2016   Procedure: ESOPHAGOGASTRODUODENOSCOPY (EGD) WITH PROPOFOL;  Surgeon: [name], MD;  Location: [facility] ENDOSCOPY;  Service: Gastroenterology;  Laterality: N/A;  . ESOPHAGOGASTRODUODENOSCOPY (EGD) WITH PROPOFOL N/A 02/16/2017   Procedure: ESOPHAGOGASTRODUODENOSCOPY (EGD) WITH PROPOFOL;  Surgeon: [name], MD;  Location: [facility] ENDOSCOPY;  Service: Gastroenterology;  Laterality: N/A;    Medical History: Past Medical History:  Diagnosis Date  . Dyspnea   . Hyperlipidemia   . Hypertension     Family History: Family History  Problem Relation Age of Onset  . Congestive Heart Failure Mother     Social History   Socioeconomic History  . Marital status: Divorced    Spouse name: Not on file  . Number of children: Not on file  . Years of education: Not on file  . Highest education level: Not on file  Occupational History  . Not on file  Social Needs  . Financial resource strain: Not on file  . Food insecurity    Worry: Not on file    Inability: Not on file  . Transportation needs    Medical: Not on file    Non-medical: Not on file  Tobacco Use  . Smoking status: Never Smoker  . Smokeless tobacco: Current User    Types: Chew  Substance and Sexual Activity  . Alcohol use: Yes    Alcohol/week: 2.0 standard drinks    Types:  1 Glasses of wine, 1 Shots of liquor per week    Comment: once every 2 weeks  . Drug use: No  . Sexual activity: Yes  Lifestyle  . Physical activity    Days per week: Not on file    Minutes per session: Not on file  . Stress: Not on file  Relationships  . Social Herbalist on phone: Not on file    Gets together: Not on file    Attends religious service: Not on file    Active member of club or organization: Not on file    Attends meetings of clubs or organizations: Not on file    Relationship status: Not on file  . Intimate partner violence    Fear of current or ex  partner: Not on file    Emotionally abused: Not on file    Physically abused: Not on file    Forced sexual activity: Not on file  Other Topics Concern  . Not on file  Social History Narrative  . Not on file      Review of Systems  Constitutional: Positive for fatigue. Negative for chills and unexpected weight change.  HENT: Negative for congestion, ear discharge, ear pain, postnasal drip, rhinorrhea, sneezing and sore throat.   Respiratory: Negative for cough, chest tightness, shortness of breath and wheezing.   Cardiovascular: Negative for chest pain and palpitations.  Gastrointestinal: Positive for nausea and vomiting. Negative for abdominal pain, constipation and diarrhea.  Endocrine: Negative for cold intolerance, heat intolerance, polydipsia and polyuria.  Musculoskeletal: Negative for arthralgias, back pain, joint swelling and neck pain.  Skin: Negative for rash.  Neurological: Positive for dizziness, weakness and headaches. Negative for tremors and numbness.       She states that she has had no further episodes of dizziness or syncope since her last visit.   Hematological: Negative for adenopathy. Does not bruise/bleed easily.  Psychiatric/Behavioral: Negative for behavioral problems (Depression), sleep disturbance and suicidal ideas. The patient is not nervous/anxious.     Today's Vitals   10/24/18 1028  BP: 122/63  Pulse: 68  Resp: 16  Temp: (!) 97.3 F (36.3 C)  SpO2: 98%  Weight: 127 lb (57.6 kg)  Height: 5\' 1"  (1.549 m)   Body mass index is 24 kg/m.  Physical Exam Vitals signs and nursing note reviewed.  Constitutional:      General: She is not in acute distress.    Appearance: Normal appearance. She is well-developed. She is not diaphoretic.  HENT:     Head: Normocephalic and atraumatic.     Mouth/Throat:     Pharynx: No oropharyngeal exudate.  Eyes:     Extraocular Movements: Extraocular movements intact.     Pupils: Pupils are equal, round, and  reactive to light.  Neck:     Musculoskeletal: Normal range of motion and neck supple.     Thyroid: No thyromegaly.     Vascular: Carotid bruit present. No JVD.     Trachea: No tracheal deviation.     Comments: Soft carotid bruit auscultated bilaterally Cardiovascular:     Rate and Rhythm: Normal rate and regular rhythm.     Heart sounds: Normal heart sounds. No murmur. No friction rub. No gallop.   Pulmonary:     Effort: Pulmonary effort is normal. No respiratory distress.     Breath sounds: Normal breath sounds. No wheezing or rales.  Chest:     Chest wall: No tenderness.  Abdominal:  General: Bowel sounds are normal.     Palpations: Abdomen is soft.     Tenderness: There is no abdominal tenderness.  Musculoskeletal: Normal range of motion.  Lymphadenopathy:     Cervical: No cervical adenopathy.  Skin:    General: Skin is warm and dry.  Neurological:     General: No focal deficit present.     Mental Status: She is alert and oriented to person, place, and time.     Cranial Nerves: No cranial nerve deficit.     Comments: Pupils are equal, round, and reactive to light and accomodation. Cranial nerves are grossly intact. Extremity strength is equal bilaterally in upper and lower extremities.   Psychiatric:        Behavior: Behavior normal.        Thought Content: Thought content normal.        Judgment: Judgment normal.    Assessment/Plan: 1. Bilateral carotid bruits Reviewed carotid doppler with the patient. There is mild plaque with less than 50% stenosis, bilaterally. She will continue simvastatin as prescribed. Blood pressure well managed. Revaluate in one year.   2. Syncope and collapse Resolved. Patient states no further episodes since her last visit. Will monitor.   3. Cerebrovascular disease Reviewed CT scan with patient. Did show evidence of known, old lacunar infarct with microvascular ischemic changes. No acute findings present. Will continue statin therapy and  control blood pressure. Reassess as indicated.    General Counseling: Aubreigh verbalizes understanding of the findings of todays visit and agrees with plan of treatment. I have discussed any further diagnostic evaluation that may be needed or ordered today. We also reviewed her medications today. she has been encouraged to call the office with any questions or concerns that should arise related to todays visit.   This patient was seen by Leretha Pol FNP Collaboration with Dr Lavera Guise as a part of collaborative care agreement   Time spent: 68 Minutes      Dr Lavera Guise Internal medicine

## 2018-10-27 DIAGNOSIS — N2581 Secondary hyperparathyroidism of renal origin: Secondary | ICD-10-CM | POA: Diagnosis not present

## 2018-10-27 DIAGNOSIS — R809 Proteinuria, unspecified: Secondary | ICD-10-CM | POA: Diagnosis not present

## 2018-10-27 DIAGNOSIS — D631 Anemia in chronic kidney disease: Secondary | ICD-10-CM | POA: Diagnosis not present

## 2018-10-27 DIAGNOSIS — N184 Chronic kidney disease, stage 4 (severe): Secondary | ICD-10-CM | POA: Diagnosis not present

## 2018-10-27 DIAGNOSIS — E872 Acidosis: Secondary | ICD-10-CM | POA: Diagnosis not present

## 2018-11-25 ENCOUNTER — Telehealth: Payer: Self-pay

## 2018-11-25 NOTE — Telephone Encounter (Signed)
Confirmed appointment with patient. klh °

## 2018-11-29 ENCOUNTER — Other Ambulatory Visit: Payer: Self-pay

## 2018-11-29 ENCOUNTER — Ambulatory Visit (INDEPENDENT_AMBULATORY_CARE_PROVIDER_SITE_OTHER): Payer: Medicare Other | Admitting: Adult Health

## 2018-11-29 ENCOUNTER — Telehealth: Payer: Self-pay

## 2018-11-29 ENCOUNTER — Encounter: Payer: Self-pay | Admitting: Adult Health

## 2018-11-29 VITALS — BP 119/62 | HR 78 | Temp 97.4°F | Resp 16 | Ht 61.0 in | Wt 126.0 lb

## 2018-11-29 DIAGNOSIS — K219 Gastro-esophageal reflux disease without esophagitis: Secondary | ICD-10-CM | POA: Diagnosis not present

## 2018-11-29 DIAGNOSIS — R300 Dysuria: Secondary | ICD-10-CM | POA: Diagnosis not present

## 2018-11-29 DIAGNOSIS — N184 Chronic kidney disease, stage 4 (severe): Secondary | ICD-10-CM | POA: Diagnosis not present

## 2018-11-29 DIAGNOSIS — I10 Essential (primary) hypertension: Secondary | ICD-10-CM | POA: Diagnosis not present

## 2018-11-29 DIAGNOSIS — Z13820 Encounter for screening for osteoporosis: Secondary | ICD-10-CM

## 2018-11-29 DIAGNOSIS — Z1231 Encounter for screening mammogram for malignant neoplasm of breast: Secondary | ICD-10-CM

## 2018-11-29 DIAGNOSIS — F17201 Nicotine dependence, unspecified, in remission: Secondary | ICD-10-CM | POA: Diagnosis not present

## 2018-11-29 DIAGNOSIS — E785 Hyperlipidemia, unspecified: Secondary | ICD-10-CM

## 2018-11-29 DIAGNOSIS — Z0001 Encounter for general adult medical examination with abnormal findings: Secondary | ICD-10-CM | POA: Diagnosis not present

## 2018-11-29 NOTE — Telephone Encounter (Signed)
Patient declined to schedule appointment for physical for next year. klh

## 2018-11-29 NOTE — Progress Notes (Signed)
[facility] [place], [place] [address]  Internal MEDICINE  Office Visit Note  Patient Name: Julia Davidson  [id number]  [id number]  Date of Service: 11/29/2018  Chief Complaint  Patient presents with  . Medical Management of Chronic Issues  . Annual Exam  . Hypertension  . Hyperlipidemia  . Quality Metric Gaps    mammogram , bone dens      HPI Pt is here for routine health maintenance examination.  She is a well appearing 68 yo AA female.  Overall she is doing well.  She denies any current need. She current uses chewing tobacco daily, and drinks about 1 22 oz beer daily.  She denies any illicit drug use. Her history includes HLD and HTN.  Her bp is well controlled at this time, and she is in need of a lipid panel to evaluate HLD. She also need mammogram and bone density to close metric gaps.   Current Medication: Outpatient Encounter Medications as of 11/29/2018  Medication Sig  . amLODipine (NORVASC) 10 MG tablet TAKE 1 TABLET BY MOUTH DAILY  . carvedilol (COREG) 6.25 MG tablet Take 6.25 mg by mouth 2 (two) times daily with a meal.   . citalopram (CELEXA) 20 MG tablet take 1 tablet by mouth once daily WITH SUPPER FOR DEPRESSION  . hydrALAZINE (APRESOLINE) 50 MG tablet Take 50 mg by mouth 3 (three) times daily.  Marland Kitchen losartan (COZAAR) 100 MG tablet Take 100 mg by mouth daily.   Marland Kitchen omeprazole (PRILOSEC) 20 MG capsule Take 1 capsule (20 mg total) by mouth daily.  Marland Kitchen PROAIR HFA 108 (90 Base) MCG/ACT inhaler take 2 puffs by mouth every 6 hours if needed  . simvastatin (ZOCOR) 40 MG tablet Take 1 tab po bedtime  . sodium bicarbonate 650 MG tablet Take 650 mg by mouth 2 (two) times daily.   . traZODone (DESYREL) 50 MG tablet Take 50 mg at bedtime by mouth.   No facility-administered encounter medications on file as of 11/29/2018.     Surgical History: Past Surgical History:  Procedure Laterality Date  . ESOPHAGOGASTRODUODENOSCOPY (EGD) WITH PROPOFOL N/A  11/19/2016   Procedure: ESOPHAGOGASTRODUODENOSCOPY (EGD) WITH PROPOFOL;  Surgeon: [name], MD;  Location: [facility] ENDOSCOPY;  Service: Gastroenterology;  Laterality: N/A;  . ESOPHAGOGASTRODUODENOSCOPY (EGD) WITH PROPOFOL N/A 02/16/2017   Procedure: ESOPHAGOGASTRODUODENOSCOPY (EGD) WITH PROPOFOL;  Surgeon: [name], MD;  Location: [facility] ENDOSCOPY;  Service: Gastroenterology;  Laterality: N/A;    Medical History: Past Medical History:  Diagnosis Date  . Dyspnea   . Hyperlipidemia   . Hypertension     Family History: Family History  Problem Relation Age of Onset  . Congestive Heart Failure Mother       Review of Systems  Constitutional: Negative for chills, fatigue and unexpected weight change.  HENT: Negative for congestion, rhinorrhea, sneezing and sore throat.   Eyes: Negative for photophobia, pain and redness.  Respiratory: Negative for cough, chest tightness and shortness of breath.   Cardiovascular: Negative for chest pain and palpitations.  Gastrointestinal: Negative for abdominal pain, constipation, diarrhea, nausea and vomiting.  Endocrine: Negative.   Genitourinary: Negative for dysuria and frequency.  Musculoskeletal: Negative for arthralgias, back pain, joint swelling and neck pain.  Skin: Negative for rash.  Allergic/Immunologic: Negative.   Neurological: Negative for tremors and numbness.  Hematological: Negative for adenopathy. Does not bruise/bleed easily.  Psychiatric/Behavioral: Negative for behavioral problems and sleep disturbance. The patient is not nervous/anxious.      Vital Signs: BP  119/62   Pulse 78   Temp (!) 97.4 F (36.3 C)   Resp 16   Ht 5\' 1"  (1.549 m)   Wt 126 lb (57.2 kg)   SpO2 100%   BMI 23.81 kg/m    Physical Exam Vitals signs and nursing note reviewed.  Constitutional:      General: She is not in acute distress.    Appearance: She is well-developed. She is not diaphoretic.  HENT:     Head: Normocephalic and atraumatic.      Mouth/Throat:     Pharynx: No oropharyngeal exudate.  Eyes:     Pupils: Pupils are equal, round, and reactive to light.  Neck:     Musculoskeletal: Normal range of motion and neck supple.     Thyroid: No thyromegaly.     Vascular: No JVD.     Trachea: No tracheal deviation.  Cardiovascular:     Rate and Rhythm: Normal rate and regular rhythm.     Heart sounds: Normal heart sounds. No murmur. No friction rub. No gallop.   Pulmonary:     Effort: Pulmonary effort is normal. No respiratory distress.     Breath sounds: Normal breath sounds. No wheezing or rales.  Chest:     Chest wall: No tenderness.  Abdominal:     Palpations: Abdomen is soft.     Tenderness: There is no abdominal tenderness. There is no guarding.  Musculoskeletal: Normal range of motion.  Lymphadenopathy:     Cervical: No cervical adenopathy.  Skin:    General: Skin is warm and dry.  Neurological:     Mental Status: She is alert and oriented to person, place, and time.     Cranial Nerves: No cranial nerve deficit.  Psychiatric:        Behavior: Behavior normal.        Thought Content: Thought content normal.        Judgment: Judgment normal.      LABS: No results found for this or any previous visit (from the past 2160 hour(s)).   Assessment/Plan: 1. Encounter for general adult medical examination with abnormal findings Will get labs and order dexa scan and mammogram to close gaps.  - CBC with Differential/Platelet - Lipid Panel With LDL/HDL Ratio - TSH - T4, free - Comprehensive metabolic panel  2. Essential hypertension Controlled, continue present management.   3. Hyperlipidemia, unspecified hyperlipidemia type Will get lipid panel, and review when available.  4. Gastroesophageal reflux disease without esophagitis Stable, continue present management.   5. CKD (chronic kidney disease) stage 4, GFR 15-29 ml/min (HCC) Continue to follow up with nephrology as scheduled.   6. Encounter for  screening mammogram for malignant neoplasm of breast - MM 3D SCREEN BREAST BILATERAL; Future  7. Screening for osteoporosis - DG Bone Density; Future  8. Dysuria - UA/M w/rflx Culture, Routine - Microscopic Examination - Urine Culture, Reflex  General Counseling: Margrit verbalizes understanding of the findings of todays visit and agrees with plan of treatment. I have discussed any further diagnostic evaluation that may be needed or ordered today. We also reviewed her medications today. she has been encouraged to call the office with any questions or concerns that should arise related to todays visit.   Orders Placed This Encounter  Procedures  . MM DIGITAL SCREENING BILATERAL  . DG Bone Density  . UA/M w/rflx Culture, Routine    No orders of the defined types were placed in this encounter.   Time spent: 30 Minutes  This patient was seen by Orson Gear AGNP-C in Collaboration with Dr Lavera Guise as a part of collaborative care agreement    Kendell Bane AGNP-C Internal Medicine

## 2018-11-30 NOTE — Progress Notes (Signed)
I think you saw her. Was she symptomatic? It looks like contamination

## 2018-12-02 LAB — URINE CULTURE, REFLEX

## 2018-12-02 LAB — MICROSCOPIC EXAMINATION
Casts: NONE SEEN /lpf
Epithelial Cells (non renal): >10 /hpf — AB (ref 0–10)
RBC, Urine: NONE SEEN /hpf (ref 0–2)

## 2018-12-02 LAB — UA/M W/RFLX CULTURE, ROUTINE
Bilirubin, UA: NEGATIVE
Glucose, UA: NEGATIVE
Ketones, UA: NEGATIVE
Nitrite, UA: NEGATIVE
RBC, UA: NEGATIVE
Specific Gravity, UA: 1.014 (ref 1.005–1.030)
Urobilinogen, Ur: 0.2 mg/dL (ref 0.2–1.0)
pH, UA: 5.5 (ref 5.0–7.5)

## 2018-12-05 ENCOUNTER — Telehealth: Payer: Self-pay | Admitting: Internal Medicine

## 2018-12-05 MED ORDER — CIPROFLOXACIN HCL 500 MG PO TABS: 500.0000 mg | ORAL_TABLET | Freq: Two times a day (BID) | ORAL | 0 refills | Status: AC

## 2018-12-05 NOTE — Telephone Encounter (Signed)
Patient was notified of UTI and sent in abx. Advised to drink more water. An appt will be made for next week to recheck urine.

## 2018-12-12 DIAGNOSIS — E0789 Other specified disorders of thyroid: Secondary | ICD-10-CM | POA: Diagnosis not present

## 2018-12-12 DIAGNOSIS — Z0001 Encounter for general adult medical examination with abnormal findings: Secondary | ICD-10-CM | POA: Diagnosis not present

## 2018-12-12 DIAGNOSIS — E079 Disorder of thyroid, unspecified: Secondary | ICD-10-CM | POA: Diagnosis not present

## 2018-12-12 DIAGNOSIS — E756 Lipid storage disorder, unspecified: Secondary | ICD-10-CM | POA: Diagnosis not present

## 2018-12-13 LAB — COMPREHENSIVE METABOLIC PANEL
ALT: 6 IU/L (ref 0–32)
AST: 19 IU/L (ref 0–40)
Albumin/Globulin Ratio: 1.6 (ref 1.2–2.2)
Albumin: 4.4 g/dL (ref 3.8–4.8)
Alkaline Phosphatase: 76 IU/L (ref 39–117)
BUN/Creatinine Ratio: 11 — ABNORMAL LOW (ref 12–28)
BUN: 43 mg/dL — ABNORMAL HIGH (ref 8–27)
Bilirubin Total: 0.3 mg/dL (ref 0.0–1.2)
CO2: 20 mmol/L (ref 20–29)
Calcium: 9.4 mg/dL (ref 8.7–10.3)
Chloride: 108 mmol/L — ABNORMAL HIGH (ref 96–106)
Creatinine, Ser: 3.97 mg/dL — ABNORMAL HIGH (ref 0.57–1.00)
GFR calc Af Amer: 12 mL/min/{1.73_m2} — ABNORMAL LOW (ref >59)
GFR calc non Af Amer: 11 mL/min/{1.73_m2} — ABNORMAL LOW (ref >59)
Globulin, Total: 2.7 g/dL (ref 1.5–4.5)
Glucose: 90 mg/dL (ref 65–99)
Potassium: 5.5 mmol/L — ABNORMAL HIGH (ref 3.5–5.2)
Sodium: 142 mmol/L (ref 134–144)
Total Protein: 7.1 g/dL (ref 6.0–8.5)

## 2018-12-13 LAB — CBC WITH DIFFERENTIAL/PLATELET
Basophils Absolute: 0.1 10*3/uL (ref 0.0–0.2)
Basos: 1 %
EOS (ABSOLUTE): 0.8 10*3/uL — ABNORMAL HIGH (ref 0.0–0.4)
Eos: 10 %
Hematocrit: 35.8 % (ref 34.0–46.6)
Hemoglobin: 12.8 g/dL (ref 11.1–15.9)
Immature Grans (Abs): 0 10*3/uL (ref 0.0–0.1)
Immature Granulocytes: 0 %
Lymphocytes Absolute: 2.2 10*3/uL (ref 0.7–3.1)
Lymphs: 28 %
MCH: 30.9 pg (ref 26.6–33.0)
MCHC: 35.8 g/dL — ABNORMAL HIGH (ref 31.5–35.7)
MCV: 87 fL (ref 79–97)
Monocytes Absolute: 0.8 10*3/uL (ref 0.1–0.9)
Monocytes: 10 %
Neutrophils Absolute: 3.9 10*3/uL (ref 1.4–7.0)
Neutrophils: 51 %
Platelets: 266 10*3/uL (ref 150–450)
RBC: 4.14 x10E6/uL (ref 3.77–5.28)
RDW: 13.5 % (ref 11.7–15.4)
WBC: 7.8 10*3/uL (ref 3.4–10.8)

## 2018-12-13 LAB — LIPID PANEL WITH LDL/HDL RATIO
Cholesterol, Total: 250 mg/dL — ABNORMAL HIGH (ref 100–199)
HDL: 89 mg/dL (ref >39)
LDL Chol Calc (NIH): 151 mg/dL — ABNORMAL HIGH (ref 0–99)
LDL/HDL Ratio: 1.7 ratio (ref 0.0–3.2)
Triglycerides: 60 mg/dL (ref 0–149)
VLDL Cholesterol Cal: 10 mg/dL (ref 5–40)

## 2018-12-13 LAB — T4, FREE: Free T4: 1.26 ng/dL (ref 0.82–1.77)

## 2018-12-13 LAB — TSH: TSH: 0.916 u[IU]/mL (ref 0.450–4.500)

## 2018-12-15 ENCOUNTER — Other Ambulatory Visit: Payer: Self-pay | Admitting: Adult Health

## 2018-12-15 ENCOUNTER — Ambulatory Visit: Payer: Medicare Other | Admitting: Adult Health

## 2018-12-15 MED ORDER — SODIUM POLYSTYRENE SULFONATE 15 GM/60ML PO SUSP: 30.0000 g | Freq: Once | ORAL | 0 refills | Status: AC

## 2018-12-15 NOTE — Progress Notes (Signed)
Sent a dose of kayexalate to patients pharmacy for Potassium 5.5

## 2018-12-16 ENCOUNTER — Telehealth: Payer: Self-pay

## 2018-12-16 NOTE — Telephone Encounter (Signed)
-----   Message from Kendell Bane, NP sent at 12/15/2018  6:04 PM EST ----- Sent A dose of kayexalate to pharmacy for patients High potassium (5.5)  Please let her know Jody.

## 2018-12-16 NOTE — Telephone Encounter (Signed)
Pt notified of high potassium and rx at pharmacy

## 2018-12-20 ENCOUNTER — Ambulatory Visit
Admission: RE | Admit: 2018-12-20 | Discharge: 2018-12-20 | Disposition: A | Discharge status: Home / Self Care | Payer: Medicare Other | Source: Ambulatory Visit | Attending: Adult Health | Admitting: Adult Health

## 2018-12-20 DIAGNOSIS — Z13820 Encounter for screening for osteoporosis: Secondary | ICD-10-CM | POA: Insufficient documentation

## 2018-12-20 DIAGNOSIS — Z1231 Encounter for screening mammogram for malignant neoplasm of breast: Secondary | ICD-10-CM | POA: Insufficient documentation

## 2018-12-20 DIAGNOSIS — M8589 Other specified disorders of bone density and structure, multiple sites: Secondary | ICD-10-CM | POA: Diagnosis not present

## 2018-12-20 DIAGNOSIS — Z780 Asymptomatic menopausal state: Secondary | ICD-10-CM | POA: Insufficient documentation

## 2018-12-24 NOTE — Progress Notes (Signed)
Treated with Cipro for UTI, refer to nephrology // increase statin on next visit

## 2018-12-26 ENCOUNTER — Other Ambulatory Visit: Payer: Self-pay | Admitting: *Deleted

## 2018-12-26 ENCOUNTER — Inpatient Hospital Stay
Admission: RE | Admit: 2018-12-26 | Discharge: 2018-12-26 | Disposition: A | Discharge status: Home / Self Care | Payer: Self-pay | Source: Ambulatory Visit | Attending: *Deleted | Admitting: *Deleted

## 2018-12-26 DIAGNOSIS — Z1231 Encounter for screening mammogram for malignant neoplasm of breast: Secondary | ICD-10-CM

## 2018-12-27 DIAGNOSIS — N2581 Secondary hyperparathyroidism of renal origin: Secondary | ICD-10-CM | POA: Diagnosis not present

## 2018-12-27 DIAGNOSIS — N184 Chronic kidney disease, stage 4 (severe): Secondary | ICD-10-CM | POA: Diagnosis not present

## 2018-12-27 DIAGNOSIS — I10 Essential (primary) hypertension: Secondary | ICD-10-CM | POA: Diagnosis not present

## 2018-12-27 DIAGNOSIS — D631 Anemia in chronic kidney disease: Secondary | ICD-10-CM | POA: Diagnosis not present

## 2018-12-27 DIAGNOSIS — R809 Proteinuria, unspecified: Secondary | ICD-10-CM | POA: Diagnosis not present

## 2018-12-27 NOTE — Progress Notes (Signed)
Pt does see nephrology

## 2019-01-04 ENCOUNTER — Telehealth: Payer: Self-pay

## 2019-01-04 NOTE — Telephone Encounter (Signed)
Confirmed appointment with patient. klh °

## 2019-01-09 ENCOUNTER — Ambulatory Visit: Payer: Medicare Other | Admitting: Adult Health

## 2019-01-10 ENCOUNTER — Other Ambulatory Visit: Payer: Self-pay

## 2019-01-10 ENCOUNTER — Encounter: Payer: Self-pay | Admitting: Adult Health

## 2019-01-10 ENCOUNTER — Encounter (INDEPENDENT_AMBULATORY_CARE_PROVIDER_SITE_OTHER): Payer: Self-pay

## 2019-01-10 ENCOUNTER — Ambulatory Visit (INDEPENDENT_AMBULATORY_CARE_PROVIDER_SITE_OTHER): Payer: Medicare Other | Admitting: Adult Health

## 2019-01-10 VITALS — BP 160/75 | Temp 96.9°F | Resp 16 | Ht 61.0 in | Wt 126.4 lb

## 2019-01-10 DIAGNOSIS — N184 Chronic kidney disease, stage 4 (severe): Secondary | ICD-10-CM

## 2019-01-10 DIAGNOSIS — I10 Essential (primary) hypertension: Secondary | ICD-10-CM | POA: Diagnosis not present

## 2019-01-10 DIAGNOSIS — E785 Hyperlipidemia, unspecified: Secondary | ICD-10-CM | POA: Diagnosis not present

## 2019-01-10 DIAGNOSIS — K219 Gastro-esophageal reflux disease without esophagitis: Secondary | ICD-10-CM | POA: Diagnosis not present

## 2019-01-10 NOTE — Progress Notes (Signed)
[facility] [place], [place] [address]  Internal MEDICINE  Office Visit Note  Patient Name: Julia Davidson  [id number]  [id number]  Date of Service: 01/10/2019  Chief Complaint  Patient presents with  . Hypertension  . Hyperlipidemia  . Follow-up    labs     HPI  Pt seen today for follow up on labs.  Her potassium was elevated at 5.5, and a dose of kayexalate was sent to her pharmacy.  She reports doing that.  She also is followed by nephrology and they have rechecked her labs.  Her repeat potassium at that time was 4.9.  Her creatinine remains elevated, and nephrology is formulating a plan at this time. This may or may not include dialysis.  The patient does not wish to go on dialysis at this time. Her blood pressure is elevated 160/75.  She currently takes 4 HTN medications. She did eat some sous meat yesterday and the salt is likely the cause for the small elevation today.    Current Medication: Outpatient Encounter Medications as of 01/10/2019  Medication Sig  . amLODipine (NORVASC) 10 MG tablet TAKE 1 TABLET BY MOUTH DAILY  . carvedilol (COREG) 6.25 MG tablet Take 6.25 mg by mouth 2 (two) times daily with a meal.   . ciprofloxacin (CIPRO) 500 MG tablet Take 1 tablet (500 mg total) by mouth 2 (two) times daily. For UTI  . hydrALAZINE (APRESOLINE) 50 MG tablet Take 50 mg by mouth 3 (three) times daily.  Marland Kitchen losartan (COZAAR) 100 MG tablet Take 100 mg by mouth daily.   Marland Kitchen omeprazole (PRILOSEC) 20 MG capsule Take 1 capsule (20 mg total) by mouth daily.  Marland Kitchen PROAIR HFA 108 (90 Base) MCG/ACT inhaler take 2 puffs by mouth every 6 hours if needed  . simvastatin (ZOCOR) 40 MG tablet Take 1 tab po bedtime  . sodium bicarbonate 650 MG tablet Take 650 mg by mouth 2 (two) times daily.   . traZODone (DESYREL) 50 MG tablet Take 50 mg at bedtime by mouth.   No facility-administered encounter medications on file as of 01/10/2019.    Surgical History: Past Surgical  History:  Procedure Laterality Date  . ESOPHAGOGASTRODUODENOSCOPY (EGD) WITH PROPOFOL N/A 11/19/2016   Procedure: ESOPHAGOGASTRODUODENOSCOPY (EGD) WITH PROPOFOL;  Surgeon: [name], MD;  Location: [facility] ENDOSCOPY;  Service: Gastroenterology;  Laterality: N/A;  . ESOPHAGOGASTRODUODENOSCOPY (EGD) WITH PROPOFOL N/A 02/16/2017   Procedure: ESOPHAGOGASTRODUODENOSCOPY (EGD) WITH PROPOFOL;  Surgeon: [name], MD;  Location: [facility] ENDOSCOPY;  Service: Gastroenterology;  Laterality: N/A;    Medical History: Past Medical History:  Diagnosis Date  . Dyspnea   . Hyperlipidemia   . Hypertension     Family History: Family History  Problem Relation Age of Onset  . Congestive Heart Failure Mother     Social History   Socioeconomic History  . Marital status: Divorced    Spouse name: Not on file  . Number of children: Not on file  . Years of education: Not on file  . Highest education level: Not on file  Occupational History  . Not on file  Tobacco Use  . Smoking status: Never Smoker  . Smokeless tobacco: Current User    Types: Chew  Substance and Sexual Activity  . Alcohol use: Yes    Alcohol/week: 2.0 standard drinks    Types: 1 Glasses of wine, 1 Shots of liquor per week    Comment: once every 2 weeks  . Drug use: No  . Sexual activity:  Yes  Other Topics Concern  . Not on file  Social History Narrative  . Not on file   Social Determinants of Health   Financial Resource Strain:   . Difficulty of Paying Living Expenses: Not on file  Food Insecurity:   . Worried About Charity fundraiser in the Last Year: Not on file  . Ran Out of Food in the Last Year: Not on file  Transportation Needs:   . Lack of Transportation (Medical): Not on file  . Lack of Transportation (Non-Medical): Not on file  Physical Activity:   . Days of Exercise per Week: Not on file  . Minutes of Exercise per Session: Not on file  Stress:   . Feeling of Stress : Not on file  Social Connections:   .  Frequency of Communication with Friends and Family: Not on file  . Frequency of Social Gatherings with Friends and Family: Not on file  . Attends Religious Services: Not on file  . Active Member of Clubs or Organizations: Not on file  . Attends Archivist Meetings: Not on file  . Marital Status: Not on file  Intimate Partner Violence:   . Fear of Current or Ex-Partner: Not on file  . Emotionally Abused: Not on file  . Physically Abused: Not on file  . Sexually Abused: Not on file      Review of Systems  Constitutional: Negative for chills, fatigue and unexpected weight change.  HENT: Negative for congestion, rhinorrhea, sneezing and sore throat.   Eyes: Negative for photophobia, pain and redness.  Respiratory: Negative for cough, chest tightness and shortness of breath.   Cardiovascular: Negative for chest pain and palpitations.  Gastrointestinal: Negative for abdominal pain, constipation, diarrhea, nausea and vomiting.  Endocrine: Negative.   Genitourinary: Negative for dysuria and frequency.  Musculoskeletal: Negative for arthralgias, back pain, joint swelling and neck pain.  Skin: Negative for rash.  Allergic/Immunologic: Negative.   Neurological: Negative for tremors and numbness.  Hematological: Negative for adenopathy. Does not bruise/bleed easily.  Psychiatric/Behavioral: Negative for behavioral problems and sleep disturbance. The patient is not nervous/anxious.     Vital Signs: BP (!) 160/75   Temp (!) 96.9 F (36.1 C)   Resp 16   Ht 5\' 1"  (1.549 m)   Wt 126 lb 6.4 oz (57.3 kg)   SpO2 97%   BMI 23.88 kg/m    Physical Exam Vitals and nursing note reviewed.  Constitutional:      General: She is not in acute distress.    Appearance: She is well-developed. She is not diaphoretic.  HENT:     Head: Normocephalic and atraumatic.     Mouth/Throat:     Pharynx: No oropharyngeal exudate.  Eyes:     Pupils: Pupils are equal, round, and reactive to light.   Neck:     Thyroid: No thyromegaly.     Vascular: No JVD.     Trachea: No tracheal deviation.  Cardiovascular:     Rate and Rhythm: Normal rate and regular rhythm.     Heart sounds: Normal heart sounds. No murmur. No friction rub. No gallop.   Pulmonary:     Effort: Pulmonary effort is normal. No respiratory distress.     Breath sounds: Normal breath sounds. No wheezing or rales.  Chest:     Chest wall: No tenderness.  Abdominal:     Palpations: Abdomen is soft.     Tenderness: There is no abdominal tenderness. There is no guarding.  Musculoskeletal:        General: Normal range of motion.     Cervical back: Normal range of motion and neck supple.  Lymphadenopathy:     Cervical: No cervical adenopathy.  Skin:    General: Skin is warm and dry.  Neurological:     Mental Status: She is alert and oriented to person, place, and time.     Cranial Nerves: No cranial nerve deficit.  Psychiatric:        Behavior: Behavior normal.        Thought Content: Thought content normal.        Judgment: Judgment normal.    Assessment/Plan: 1. Essential hypertension Continue medications.  Take BP at home.  If consistenly greater than 140/90, call or return to clinic.  2. CKD (chronic kidney disease) stage 4, GFR 15-29 ml/min [facility]) Seeing Nephrology, continue to follow up at scheduled.    3. Gastroesophageal reflux disease without esophagitis Stable, continue current management.  4. Hyperlipidemia, unspecified hyperlipidemia type Elevated at last draw, pt is on max dose of simvastatin.  General Counseling: Julia Davidson verbalizes understanding of the findings of todays visit and agrees with plan of treatment. I have discussed any further diagnostic evaluation that may be needed or ordered today. We also reviewed her medications today. she has been encouraged to call the office with any questions or concerns that should arise related to todays visit.    No orders of the defined types were  placed in this encounter.   No orders of the defined types were placed in this encounter.   Time spent: 25 Minutes   This patient was seen by Orson Gear AGNP-C in Collaboration with Dr Lavera Guise as a part of collaborative care agreement     Kendell Bane AGNP-C Internal medicine

## 2019-02-09 DIAGNOSIS — I10 Essential (primary) hypertension: Secondary | ICD-10-CM | POA: Diagnosis not present

## 2019-02-09 DIAGNOSIS — N2581 Secondary hyperparathyroidism of renal origin: Secondary | ICD-10-CM | POA: Diagnosis not present

## 2019-02-09 DIAGNOSIS — D631 Anemia in chronic kidney disease: Secondary | ICD-10-CM | POA: Diagnosis not present

## 2019-02-09 DIAGNOSIS — N184 Chronic kidney disease, stage 4 (severe): Secondary | ICD-10-CM | POA: Diagnosis not present

## 2019-02-09 DIAGNOSIS — R809 Proteinuria, unspecified: Secondary | ICD-10-CM | POA: Diagnosis not present

## 2019-03-03 ENCOUNTER — Other Ambulatory Visit: Payer: Self-pay

## 2019-03-03 MED ORDER — SIMVASTATIN 40 MG PO TABS: ORAL_TABLET | ORAL | 2 refills | Status: DC

## 2019-03-27 ENCOUNTER — Other Ambulatory Visit: Payer: Self-pay

## 2019-03-27 MED ORDER — AMLODIPINE BESYLATE 10 MG PO TABS: ORAL_TABLET | ORAL | 1 refills | Status: DC

## 2019-03-30 DIAGNOSIS — E872 Acidosis: Secondary | ICD-10-CM | POA: Diagnosis not present

## 2019-03-30 DIAGNOSIS — D631 Anemia in chronic kidney disease: Secondary | ICD-10-CM | POA: Diagnosis not present

## 2019-03-30 DIAGNOSIS — I10 Essential (primary) hypertension: Secondary | ICD-10-CM | POA: Diagnosis not present

## 2019-03-30 DIAGNOSIS — N184 Chronic kidney disease, stage 4 (severe): Secondary | ICD-10-CM | POA: Diagnosis not present

## 2019-03-30 DIAGNOSIS — N2581 Secondary hyperparathyroidism of renal origin: Secondary | ICD-10-CM | POA: Diagnosis not present

## 2019-05-08 ENCOUNTER — Telehealth: Payer: Self-pay

## 2019-05-08 NOTE — Telephone Encounter (Signed)
Confirmed appointment on 05/10/2019 and screened for covid. klh

## 2019-05-10 ENCOUNTER — Ambulatory Visit: Payer: Medicare Other | Admitting: Adult Health

## 2019-05-16 ENCOUNTER — Telehealth: Payer: Self-pay

## 2019-05-16 NOTE — Telephone Encounter (Signed)
Confirmed appointment on 05/18/2019 and screened for covid. klh

## 2019-05-18 ENCOUNTER — Ambulatory Visit: Payer: Medicare Other | Admitting: Adult Health

## 2019-05-19 ENCOUNTER — Telehealth: Payer: Self-pay

## 2019-05-19 NOTE — Telephone Encounter (Signed)
Confirmed appointment on 05/23/2019 and screened for covid. klh

## 2019-05-23 ENCOUNTER — Encounter: Payer: Self-pay | Admitting: Adult Health

## 2019-05-23 ENCOUNTER — Other Ambulatory Visit: Payer: Self-pay

## 2019-05-23 ENCOUNTER — Ambulatory Visit (INDEPENDENT_AMBULATORY_CARE_PROVIDER_SITE_OTHER): Payer: Medicare Other | Admitting: Adult Health

## 2019-05-23 VITALS — BP 140/72 | HR 69 | Temp 97.2°F | Resp 16 | Ht 61.0 in | Wt 125.6 lb

## 2019-05-23 DIAGNOSIS — K219 Gastro-esophageal reflux disease without esophagitis: Secondary | ICD-10-CM | POA: Diagnosis not present

## 2019-05-23 DIAGNOSIS — I10 Essential (primary) hypertension: Secondary | ICD-10-CM

## 2019-05-23 DIAGNOSIS — E785 Hyperlipidemia, unspecified: Secondary | ICD-10-CM

## 2019-05-23 DIAGNOSIS — N184 Chronic kidney disease, stage 4 (severe): Secondary | ICD-10-CM | POA: Diagnosis not present

## 2019-05-23 NOTE — Progress Notes (Signed)
[facility] [place], [place] [address]  Internal MEDICINE  Office Visit Note  Patient Name: Julia Davidson  [id number]  [id number]  Date of Service: 05/23/2019  Chief Complaint  Patient presents with  . Follow-up  . Hypertension  . Hyperlipidemia    HPI Pt is here for follow up on HTN, HLD, as well as kidney disease. She is seeing nephrology for her kidneys, they would like her to go on dialysis, but she is not ready at this time.  She reports that she is considering it, and is following closely with nephrology.  She denies any issues at this time. No new complaints. Her blood pressure today is good.  Denies Chest pain, Shortness of breath, palpitations, headache, or blurred vision.     Current Medication: Outpatient Encounter Medications as of 05/23/2019  Medication Sig  . amLODipine (NORVASC) 10 MG tablet TAKE 1 TABLET BY MOUTH DAILY  . carvedilol (COREG) 6.25 MG tablet Take 6.25 mg by mouth 2 (two) times daily with a meal.   . ciprofloxacin (CIPRO) 500 MG tablet Take 1 tablet (500 mg total) by mouth 2 (two) times daily. For UTI  . hydrALAZINE (APRESOLINE) 50 MG tablet Take 50 mg by mouth 3 (three) times daily.  Marland Kitchen losartan (COZAAR) 100 MG tablet Take 100 mg by mouth daily.   Marland Kitchen omeprazole (PRILOSEC) 20 MG capsule Take 1 capsule (20 mg total) by mouth daily.  Marland Kitchen PROAIR HFA 108 (90 Base) MCG/ACT inhaler take 2 puffs by mouth every 6 hours if needed  . simvastatin (ZOCOR) 40 MG tablet Take 1 tab po bedtime  . sodium bicarbonate 650 MG tablet Take 650 mg by mouth 2 (two) times daily.   . traZODone (DESYREL) 50 MG tablet Take 50 mg at bedtime by mouth.   No facility-administered encounter medications on file as of 05/23/2019.    Surgical History: Past Surgical History:  Procedure Laterality Date  . ESOPHAGOGASTRODUODENOSCOPY (EGD) WITH PROPOFOL N/A 11/19/2016   Procedure: ESOPHAGOGASTRODUODENOSCOPY (EGD) WITH PROPOFOL;  Surgeon: [name], MD;   Location: [facility] ENDOSCOPY;  Service: Gastroenterology;  Laterality: N/A;  . ESOPHAGOGASTRODUODENOSCOPY (EGD) WITH PROPOFOL N/A 02/16/2017   Procedure: ESOPHAGOGASTRODUODENOSCOPY (EGD) WITH PROPOFOL;  Surgeon: [name], MD;  Location: [facility] ENDOSCOPY;  Service: Gastroenterology;  Laterality: N/A;    Medical History: Past Medical History:  Diagnosis Date  . Dyspnea   . Hyperlipidemia   . Hypertension     Family History: Family History  Problem Relation Age of Onset  . Congestive Heart Failure Mother     Social History   Socioeconomic History  . Marital status: Divorced    Spouse name: Not on file  . Number of children: Not on file  . Years of education: Not on file  . Highest education level: Not on file  Occupational History  . Not on file  Tobacco Use  . Smoking status: Never Smoker  . Smokeless tobacco: Current User    Types: Chew  Substance and Sexual Activity  . Alcohol use: Yes    Alcohol/week: 2.0 standard drinks    Types: 1 Glasses of wine, 1 Shots of liquor per week    Comment: once every 2 weeks  . Drug use: No  . Sexual activity: Yes  Other Topics Concern  . Not on file  Social History Narrative  . Not on file   Social Determinants of Health   Financial Resource Strain:   . Difficulty of Paying Living Expenses:   Food Insecurity:   .  Worried About Charity fundraiser in the Last Year:   . Arboriculturist in the Last Year:   Transportation Needs:   . Film/video editor (Medical):   Marland Kitchen Lack of Transportation (Non-Medical):   Physical Activity:   . Days of Exercise per Week:   . Minutes of Exercise per Session:   Stress:   . Feeling of Stress :   Social Connections:   . Frequency of Communication with Friends and Family:   . Frequency of Social Gatherings with Friends and Family:   . Attends Religious Services:   . Active Member of Clubs or Organizations:   . Attends Archivist Meetings:   Marland Kitchen Marital Status:   Intimate Partner  Violence:   . Fear of Current or Ex-Partner:   . Emotionally Abused:   Marland Kitchen Physically Abused:   . Sexually Abused:       Review of Systems  Constitutional: Negative for chills, fatigue and unexpected weight change.  HENT: Negative for congestion, rhinorrhea, sneezing and sore throat.   Eyes: Negative for photophobia, pain and redness.  Respiratory: Negative for cough, chest tightness and shortness of breath.   Cardiovascular: Negative for chest pain and palpitations.  Gastrointestinal: Negative for abdominal pain, constipation, diarrhea, nausea and vomiting.  Endocrine: Negative.   Genitourinary: Negative for dysuria and frequency.  Musculoskeletal: Negative for arthralgias, back pain, joint swelling and neck pain.  Skin: Negative for rash.  Allergic/Immunologic: Negative.   Neurological: Negative for tremors and numbness.  Hematological: Negative for adenopathy. Does not bruise/bleed easily.  Psychiatric/Behavioral: Negative for behavioral problems and sleep disturbance. The patient is not nervous/anxious.     Vital Signs: BP (!) 141/67   Pulse 69   Temp (!) 97.2 F (36.2 C)   Resp 16   Ht 5\' 1"  (1.549 m)   Wt 125 lb 9.6 oz (57 kg)   SpO2 100%   BMI 23.73 kg/m    Physical Exam Vitals and nursing note reviewed.  Constitutional:      General: She is not in acute distress.    Appearance: She is well-developed. She is not diaphoretic.  HENT:     Head: Normocephalic and atraumatic.     Mouth/Throat:     Pharynx: No oropharyngeal exudate.  Eyes:     Pupils: Pupils are equal, round, and reactive to light.  Neck:     Thyroid: No thyromegaly.     Vascular: No JVD.     Trachea: No tracheal deviation.  Cardiovascular:     Rate and Rhythm: Normal rate and regular rhythm.     Heart sounds: Normal heart sounds. No murmur. No friction rub. No gallop.   Pulmonary:     Effort: Pulmonary effort is normal. No respiratory distress.     Breath sounds: Normal breath sounds. No  wheezing or rales.  Chest:     Chest wall: No tenderness.  Abdominal:     Palpations: Abdomen is soft.     Tenderness: There is no abdominal tenderness. There is no guarding.  Musculoskeletal:        General: Normal range of motion.     Cervical back: Normal range of motion and neck supple.  Lymphadenopathy:     Cervical: No cervical adenopathy.  Skin:    General: Skin is warm and dry.  Neurological:     Mental Status: She is alert and oriented to person, place, and time.     Cranial Nerves: No cranial nerve deficit.  Psychiatric:  Behavior: Behavior normal.        Thought Content: Thought content normal.        Judgment: Judgment normal.    Assessment/Plan: 1. Essential hypertension Well controlled currently, continue present management.   2. CKD (chronic kidney disease) stage 4, GFR 15-29 ml/min (HCC) Continue to follow up with nephrology as directed.   3. Gastroesophageal reflux disease without esophagitis Stable, good control of symptoms.   4. Hyperlipidemia, unspecified hyperlipidemia type Continue statin as prescribed.   General Counseling: Julia Davidson verbalizes understanding of the findings of todays visit and agrees with plan of treatment. I have discussed any further diagnostic evaluation that may be needed or ordered today. We also reviewed her medications today. she has been encouraged to call the office with any questions or concerns that should arise related to todays visit.    No orders of the defined types were placed in this encounter.   No orders of the defined types were placed in this encounter.   Time spent: 30 Minutes   This patient was seen by Orson Gear AGNP-C in Collaboration with Dr Lavera Guise as a part of collaborative care agreement     Kendell Bane AGNP-C Internal medicine

## 2019-05-25 DIAGNOSIS — R809 Proteinuria, unspecified: Secondary | ICD-10-CM | POA: Diagnosis not present

## 2019-05-25 DIAGNOSIS — D631 Anemia in chronic kidney disease: Secondary | ICD-10-CM | POA: Diagnosis not present

## 2019-05-25 DIAGNOSIS — N2581 Secondary hyperparathyroidism of renal origin: Secondary | ICD-10-CM | POA: Diagnosis not present

## 2019-05-25 DIAGNOSIS — N185 Chronic kidney disease, stage 5: Secondary | ICD-10-CM | POA: Diagnosis not present

## 2019-05-25 DIAGNOSIS — N184 Chronic kidney disease, stage 4 (severe): Secondary | ICD-10-CM | POA: Diagnosis not present

## 2019-05-25 DIAGNOSIS — I10 Essential (primary) hypertension: Secondary | ICD-10-CM | POA: Diagnosis not present

## 2019-06-22 DIAGNOSIS — R809 Proteinuria, unspecified: Secondary | ICD-10-CM | POA: Diagnosis not present

## 2019-06-22 DIAGNOSIS — D631 Anemia in chronic kidney disease: Secondary | ICD-10-CM | POA: Diagnosis not present

## 2019-06-22 DIAGNOSIS — N185 Chronic kidney disease, stage 5: Secondary | ICD-10-CM | POA: Diagnosis not present

## 2019-06-22 DIAGNOSIS — N2581 Secondary hyperparathyroidism of renal origin: Secondary | ICD-10-CM | POA: Diagnosis not present

## 2019-06-22 DIAGNOSIS — I10 Essential (primary) hypertension: Secondary | ICD-10-CM | POA: Diagnosis not present

## 2019-07-20 ENCOUNTER — Other Ambulatory Visit: Payer: Self-pay | Admitting: Adult Health

## 2019-07-31 DIAGNOSIS — I10 Essential (primary) hypertension: Secondary | ICD-10-CM | POA: Diagnosis not present

## 2019-07-31 DIAGNOSIS — N185 Chronic kidney disease, stage 5: Secondary | ICD-10-CM | POA: Diagnosis not present

## 2019-07-31 DIAGNOSIS — D631 Anemia in chronic kidney disease: Secondary | ICD-10-CM | POA: Diagnosis not present

## 2019-07-31 DIAGNOSIS — N2581 Secondary hyperparathyroidism of renal origin: Secondary | ICD-10-CM | POA: Diagnosis not present

## 2019-07-31 DIAGNOSIS — R809 Proteinuria, unspecified: Secondary | ICD-10-CM | POA: Diagnosis not present

## 2019-08-23 ENCOUNTER — Ambulatory Visit: Payer: Medicare Other | Admitting: Hospice and Palliative Medicine

## 2019-10-19 DIAGNOSIS — N2581 Secondary hyperparathyroidism of renal origin: Secondary | ICD-10-CM | POA: Diagnosis not present

## 2019-10-19 DIAGNOSIS — N185 Chronic kidney disease, stage 5: Secondary | ICD-10-CM | POA: Diagnosis not present

## 2019-10-19 DIAGNOSIS — R809 Proteinuria, unspecified: Secondary | ICD-10-CM | POA: Diagnosis not present

## 2019-10-19 DIAGNOSIS — I10 Essential (primary) hypertension: Secondary | ICD-10-CM | POA: Diagnosis not present

## 2019-10-19 DIAGNOSIS — D631 Anemia in chronic kidney disease: Secondary | ICD-10-CM | POA: Diagnosis not present

## 2019-10-29 ENCOUNTER — Other Ambulatory Visit: Payer: Self-pay | Admitting: Adult Health

## 2019-11-29 DIAGNOSIS — N185 Chronic kidney disease, stage 5: Secondary | ICD-10-CM | POA: Diagnosis not present

## 2019-11-29 DIAGNOSIS — I10 Essential (primary) hypertension: Secondary | ICD-10-CM | POA: Diagnosis not present

## 2019-11-29 DIAGNOSIS — R809 Proteinuria, unspecified: Secondary | ICD-10-CM | POA: Diagnosis not present

## 2019-11-29 DIAGNOSIS — N2581 Secondary hyperparathyroidism of renal origin: Secondary | ICD-10-CM | POA: Diagnosis not present

## 2019-11-29 DIAGNOSIS — D631 Anemia in chronic kidney disease: Secondary | ICD-10-CM | POA: Diagnosis not present

## 2019-12-01 ENCOUNTER — Ambulatory Visit: Payer: Medicare Other | Admitting: Nurse Practitioner

## 2019-12-12 ENCOUNTER — Ambulatory Visit: Payer: Medicare Other | Admitting: Nurse Practitioner

## 2019-12-12 ENCOUNTER — Encounter: Payer: Self-pay | Admitting: Gastroenterology

## 2019-12-12 ENCOUNTER — Ambulatory Visit: Payer: Medicare Other | Admitting: Gastroenterology

## 2019-12-18 ENCOUNTER — Telehealth: Payer: Self-pay

## 2019-12-18 NOTE — Telephone Encounter (Signed)
Rescheduled missed office visit on 12-12-19. Patient aware to keep this appointment there could be a possibility we do not reschedule if this one is missed.

## 2019-12-20 ENCOUNTER — Other Ambulatory Visit: Payer: Self-pay | Admitting: Internal Medicine

## 2019-12-20 ENCOUNTER — Other Ambulatory Visit: Payer: Self-pay

## 2019-12-20 MED ORDER — AMLODIPINE BESYLATE 10 MG PO TABS: ORAL_TABLET | ORAL | 0 refills | Status: DC

## 2019-12-20 NOTE — Telephone Encounter (Signed)
Pt need appt for 90 days

## 2020-01-09 ENCOUNTER — Ambulatory Visit: Payer: Medicare Other | Admitting: Hospice and Palliative Medicine

## 2020-01-16 ENCOUNTER — Telehealth: Payer: Self-pay

## 2020-01-16 NOTE — Telephone Encounter (Signed)
Patient was mailed a discharge letter on 01-16-20. No longer a patient of [facility]. Copy of discharge letter placed in scan.

## 2020-02-01 DIAGNOSIS — N2581 Secondary hyperparathyroidism of renal origin: Secondary | ICD-10-CM | POA: Diagnosis not present

## 2020-02-01 DIAGNOSIS — E872 Acidosis: Secondary | ICD-10-CM | POA: Diagnosis not present

## 2020-02-01 DIAGNOSIS — N185 Chronic kidney disease, stage 5: Secondary | ICD-10-CM | POA: Diagnosis not present

## 2020-02-01 DIAGNOSIS — I10 Essential (primary) hypertension: Secondary | ICD-10-CM | POA: Diagnosis not present

## 2020-02-01 DIAGNOSIS — D631 Anemia in chronic kidney disease: Secondary | ICD-10-CM | POA: Diagnosis not present

## 2020-02-01 DIAGNOSIS — R809 Proteinuria, unspecified: Secondary | ICD-10-CM | POA: Diagnosis not present

## 2020-02-05 ENCOUNTER — Other Ambulatory Visit: Payer: Self-pay | Admitting: Internal Medicine

## 2020-02-08 DIAGNOSIS — N185 Chronic kidney disease, stage 5: Secondary | ICD-10-CM | POA: Diagnosis not present

## 2020-02-08 DIAGNOSIS — I10 Essential (primary) hypertension: Secondary | ICD-10-CM | POA: Diagnosis not present

## 2020-02-13 DIAGNOSIS — Z4902 Encounter for fitting and adjustment of peritoneal dialysis catheter: Secondary | ICD-10-CM | POA: Diagnosis not present

## 2020-02-13 DIAGNOSIS — N185 Chronic kidney disease, stage 5: Secondary | ICD-10-CM | POA: Diagnosis not present

## 2020-02-13 DIAGNOSIS — K219 Gastro-esophageal reflux disease without esophagitis: Secondary | ICD-10-CM | POA: Diagnosis not present

## 2020-02-13 DIAGNOSIS — I120 Hypertensive chronic kidney disease with stage 5 chronic kidney disease or end stage renal disease: Secondary | ICD-10-CM | POA: Diagnosis not present

## 2020-02-13 DIAGNOSIS — E785 Hyperlipidemia, unspecified: Secondary | ICD-10-CM | POA: Diagnosis not present

## 2020-02-22 ENCOUNTER — Other Ambulatory Visit: Payer: Self-pay

## 2020-02-22 ENCOUNTER — Ambulatory Visit (INDEPENDENT_AMBULATORY_CARE_PROVIDER_SITE_OTHER): Payer: Medicare Other | Admitting: Gastroenterology

## 2020-02-22 ENCOUNTER — Encounter: Payer: Self-pay | Admitting: Gastroenterology

## 2020-02-22 VITALS — BP 188/84 | HR 93 | Ht 61.0 in | Wt 111.8 lb

## 2020-02-22 DIAGNOSIS — K219 Gastro-esophageal reflux disease without esophagitis: Secondary | ICD-10-CM

## 2020-02-22 DIAGNOSIS — K2210 Ulcer of esophagus without bleeding: Secondary | ICD-10-CM

## 2020-02-22 MED ORDER — OMEPRAZOLE 20 MG PO CPDR: 20.0000 mg | DELAYED_RELEASE_CAPSULE | Freq: Every day | ORAL | 3 refills | Status: AC

## 2020-02-22 MED ORDER — FAMOTIDINE 20 MG PO TABS: 20.0000 mg | ORAL_TABLET | Freq: Two times a day (BID) | ORAL | 3 refills | Status: AC

## 2020-02-22 NOTE — Progress Notes (Signed)
[name] MD, MRCP(U.K) [address]  [place]  [place], [place] [address]  Main: [phone]  Fax: [phone]   Primary Care Physician: No primary care provider on file.  Primary Gastroenterologist:  Dr. [name]   Follow-up for GERD  HPI: Julia Davidson is a 70 y.o. female   Summary of history : I have been following her since 2018 for GERD.  EGD in 2018 4 cm hiatal hernia. Squamous papilloma normal resected in the esophagus features of gastritis and esophagitis noted 02/16/2017 : EGD: Normal .    Interval history4/17/2020-02/22/2020  She is here today to get a refill for Prilosec which she has been on for many years.  With Prilosec 40 mg a day she has absolutely no symptoms of acid reflux.  She has stage IV CKD and is in the process of receiving a port for possible dialysis.  Current Outpatient Medications  Medication Sig Dispense Refill  . amLODipine (NORVASC) 10 MG tablet TAKE 1 TABLET BY MOUTH DAILY 25 tablet 0  . carvedilol (COREG) 6.25 MG tablet Take 6.25 mg by mouth 2 (two) times daily with a meal.     . ciprofloxacin (CIPRO) 500 MG tablet Take 1 tablet (500 mg total) by mouth 2 (two) times daily. For UTI 20 tablet 0  . famotidine (PEPCID) 20 MG tablet Take 1 tablet (20 mg total) by mouth 2 (two) times daily. 60 tablet 3  . hydrALAZINE (APRESOLINE) 50 MG tablet Take 50 mg by mouth 3 (three) times daily.  1  . losartan (COZAAR) 100 MG tablet Take 100 mg by mouth daily.   0  . PROAIR HFA 108 (90 Base) MCG/ACT inhaler take 2 puffs by mouth every 6 hours if needed  0  . simvastatin (ZOCOR) 40 MG tablet TAKE 1 TABLET BY MOUTH AT BEDTIME 90 tablet 0  . sodium bicarbonate 650 MG tablet Take 650 mg by mouth 2 (two) times daily.   1  . traZODone (DESYREL) 50 MG tablet Take 50 mg at bedtime by mouth.  0  . omeprazole (PRILOSEC) 20 MG capsule Take 1 capsule (20 mg total) by mouth daily. 90 capsule 3   No current facility-administered medications for this  visit.    Allergies as of 02/22/2020  . (No Known Allergies)    ROS:  General: Negative for anorexia, weight loss, fever, chills, fatigue, weakness. ENT: Negative for hoarseness, difficulty swallowing , nasal congestion. CV: Negative for chest pain, angina, palpitations, dyspnea on exertion, peripheral edema.  Respiratory: Negative for dyspnea at rest, dyspnea on exertion, cough, sputum, wheezing.  GI: See history of present illness. GU:  Negative for dysuria, hematuria, urinary incontinence, urinary frequency, nocturnal urination.  Endo: Negative for unusual weight change.    Physical Examination:   BP (!) 188/84 (BP Location: Left Arm, Patient Position: Sitting, Cuff Size: Normal)   Pulse 93   Ht 5\' 1"  (1.549 m)   Wt 111 lb 12.8 oz (50.7 kg)   BMI 21.12 kg/m   General: Well-nourished, well-developed in no acute distress.  Eyes: No icterus. Conjunctivae pink. Neuro: Alert and oriented x 3.  Grossly intact. Skin: Warm and dry, no jaundice.   Psych: Alert and cooperative, normal mood and affect.   Imaging Studies: No results found.  Assessment and Plan:   Julia Davidson is a 70 y.o. y/o female with a history of GERD.In 2018 endoscopy demonstrated esophagitis, esophageal ulcers and  squamous papilloma which was benign was completely excised.Repeat EGD was normal. Doing well  for her reflux. Stable on prilosec 40 mg once a day.  Discussed the benefits and risks of long-term PPI and explained that it may not be a bad idea to try to change her to famotidine 20 mg twice daily instead of Prilosec due to higher safety profile.  Obviously if the Pepcid does not work she would need to switch back to Prilosec.  It is unlikely that she would ever be able to go off acid suppression due to the presence of a large hiatal hernia which will always subject her to acid reflux.  I will send in a prescription for Pepcid 20 mg twice daily for 90 days and 3 refills  Dr [name]  MD,MRCP  [facility]) Follow up in as needed

## 2020-02-26 DIAGNOSIS — E875 Hyperkalemia: Secondary | ICD-10-CM | POA: Diagnosis not present

## 2020-02-26 DIAGNOSIS — E872 Acidosis: Secondary | ICD-10-CM | POA: Diagnosis not present

## 2020-02-26 DIAGNOSIS — N185 Chronic kidney disease, stage 5: Secondary | ICD-10-CM | POA: Diagnosis not present

## 2020-02-26 DIAGNOSIS — N2581 Secondary hyperparathyroidism of renal origin: Secondary | ICD-10-CM | POA: Diagnosis not present

## 2020-02-26 DIAGNOSIS — I10 Essential (primary) hypertension: Secondary | ICD-10-CM | POA: Diagnosis not present

## 2020-02-26 DIAGNOSIS — D631 Anemia in chronic kidney disease: Secondary | ICD-10-CM | POA: Diagnosis not present

## 2020-03-13 ENCOUNTER — Telehealth: Payer: Self-pay

## 2020-03-13 DIAGNOSIS — R7612 Nonspecific reaction to cell mediated immunity measurement of gamma interferon antigen response without active tuberculosis: Secondary | ICD-10-CM

## 2020-03-13 NOTE — Telephone Encounter (Signed)
TC with patient. Informed patient that the [place]. Sent her +QFT to [facility].  Patient states has always been positive and has had several TB screens here at the health dept[facility]  (TB screens sent for scanning).   Patient denies any changes in health or sx's. Patient will informed kidney center of hx of positive TB tests. Aileen Fass, RN    TB screen flowsheet completed today. Aileen Fass, RN

## 2020-03-18 DIAGNOSIS — N185 Chronic kidney disease, stage 5: Secondary | ICD-10-CM | POA: Diagnosis not present

## 2020-03-18 DIAGNOSIS — I10 Essential (primary) hypertension: Secondary | ICD-10-CM | POA: Diagnosis not present

## 2020-03-18 DIAGNOSIS — D631 Anemia in chronic kidney disease: Secondary | ICD-10-CM | POA: Diagnosis not present

## 2020-03-18 DIAGNOSIS — E872 Acidosis: Secondary | ICD-10-CM | POA: Diagnosis not present

## 2020-03-18 DIAGNOSIS — N2581 Secondary hyperparathyroidism of renal origin: Secondary | ICD-10-CM | POA: Diagnosis not present

## 2020-04-01 DIAGNOSIS — I10 Essential (primary) hypertension: Secondary | ICD-10-CM | POA: Diagnosis not present

## 2020-04-01 DIAGNOSIS — N185 Chronic kidney disease, stage 5: Secondary | ICD-10-CM | POA: Diagnosis not present

## 2020-04-01 DIAGNOSIS — R809 Proteinuria, unspecified: Secondary | ICD-10-CM | POA: Diagnosis not present

## 2020-04-01 DIAGNOSIS — D631 Anemia in chronic kidney disease: Secondary | ICD-10-CM | POA: Diagnosis not present

## 2020-04-03 DIAGNOSIS — N261 Atrophy of kidney (terminal): Secondary | ICD-10-CM | POA: Diagnosis not present

## 2020-04-03 DIAGNOSIS — E7800 Pure hypercholesterolemia, unspecified: Secondary | ICD-10-CM | POA: Diagnosis not present

## 2020-04-03 DIAGNOSIS — I447 Left bundle-branch block, unspecified: Secondary | ICD-10-CM | POA: Diagnosis not present

## 2020-04-03 DIAGNOSIS — N184 Chronic kidney disease, stage 4 (severe): Secondary | ICD-10-CM | POA: Diagnosis not present

## 2020-04-03 DIAGNOSIS — Z4902 Encounter for fitting and adjustment of peritoneal dialysis catheter: Secondary | ICD-10-CM | POA: Diagnosis not present

## 2020-04-03 DIAGNOSIS — I120 Hypertensive chronic kidney disease with stage 5 chronic kidney disease or end stage renal disease: Secondary | ICD-10-CM | POA: Diagnosis not present

## 2020-04-03 DIAGNOSIS — R001 Bradycardia, unspecified: Secondary | ICD-10-CM | POA: Diagnosis not present

## 2020-04-03 DIAGNOSIS — K219 Gastro-esophageal reflux disease without esophagitis: Secondary | ICD-10-CM | POA: Diagnosis not present

## 2020-04-03 DIAGNOSIS — N186 End stage renal disease: Secondary | ICD-10-CM | POA: Diagnosis not present

## 2020-04-03 DIAGNOSIS — R9431 Abnormal electrocardiogram [ECG] [EKG]: Secondary | ICD-10-CM | POA: Diagnosis not present

## 2020-04-10 ENCOUNTER — Other Ambulatory Visit: Payer: Medicare Other

## 2020-04-10 ENCOUNTER — Other Ambulatory Visit: Payer: Self-pay | Admitting: Nephrology

## 2020-04-10 DIAGNOSIS — Z201 Contact with and (suspected) exposure to tuberculosis: Secondary | ICD-10-CM

## 2020-04-15 ENCOUNTER — Other Ambulatory Visit: Payer: Self-pay

## 2020-04-15 ENCOUNTER — Ambulatory Visit
Admission: RE | Admit: 2020-04-15 | Discharge: 2020-04-15 | Disposition: A | Discharge status: Home / Self Care | Payer: Medicare Other | Attending: Nephrology | Admitting: Nephrology

## 2020-04-15 ENCOUNTER — Ambulatory Visit
Admission: RE | Admit: 2020-04-15 | Discharge: 2020-04-15 | Disposition: A | Discharge status: Home / Self Care | Payer: Medicare Other | Source: Ambulatory Visit | Attending: Nephrology | Admitting: Nephrology

## 2020-04-15 DIAGNOSIS — Z201 Contact with and (suspected) exposure to tuberculosis: Secondary | ICD-10-CM | POA: Insufficient documentation

## 2020-04-16 DIAGNOSIS — N186 End stage renal disease: Secondary | ICD-10-CM | POA: Diagnosis not present

## 2020-04-16 DIAGNOSIS — Z992 Dependence on renal dialysis: Secondary | ICD-10-CM | POA: Diagnosis not present

## 2020-04-17 DIAGNOSIS — Z992 Dependence on renal dialysis: Secondary | ICD-10-CM | POA: Diagnosis not present

## 2020-04-17 DIAGNOSIS — Z1159 Encounter for screening for other viral diseases: Secondary | ICD-10-CM | POA: Diagnosis not present

## 2020-04-17 DIAGNOSIS — D509 Iron deficiency anemia, unspecified: Secondary | ICD-10-CM | POA: Diagnosis not present

## 2020-04-17 DIAGNOSIS — N186 End stage renal disease: Secondary | ICD-10-CM | POA: Diagnosis not present

## 2020-04-23 DIAGNOSIS — Z992 Dependence on renal dialysis: Secondary | ICD-10-CM | POA: Diagnosis not present

## 2020-04-23 DIAGNOSIS — N186 End stage renal disease: Secondary | ICD-10-CM | POA: Diagnosis not present

## 2020-04-24 DIAGNOSIS — N184 Chronic kidney disease, stage 4 (severe): Secondary | ICD-10-CM | POA: Diagnosis not present

## 2020-04-25 DIAGNOSIS — Z992 Dependence on renal dialysis: Secondary | ICD-10-CM | POA: Diagnosis not present

## 2020-04-25 DIAGNOSIS — N186 End stage renal disease: Secondary | ICD-10-CM | POA: Diagnosis not present

## 2020-04-26 DIAGNOSIS — N186 End stage renal disease: Secondary | ICD-10-CM | POA: Diagnosis not present

## 2020-04-26 DIAGNOSIS — Z992 Dependence on renal dialysis: Secondary | ICD-10-CM | POA: Diagnosis not present

## 2020-04-29 DIAGNOSIS — N186 End stage renal disease: Secondary | ICD-10-CM | POA: Diagnosis not present

## 2020-04-29 DIAGNOSIS — Z992 Dependence on renal dialysis: Secondary | ICD-10-CM | POA: Diagnosis not present

## 2020-05-01 DIAGNOSIS — N186 End stage renal disease: Secondary | ICD-10-CM | POA: Diagnosis not present

## 2020-05-01 DIAGNOSIS — Z992 Dependence on renal dialysis: Secondary | ICD-10-CM | POA: Diagnosis not present

## 2020-05-02 DIAGNOSIS — N186 End stage renal disease: Secondary | ICD-10-CM | POA: Diagnosis not present

## 2020-05-02 DIAGNOSIS — Z992 Dependence on renal dialysis: Secondary | ICD-10-CM | POA: Diagnosis not present

## 2020-05-06 DIAGNOSIS — Z992 Dependence on renal dialysis: Secondary | ICD-10-CM | POA: Diagnosis not present

## 2020-05-06 DIAGNOSIS — N186 End stage renal disease: Secondary | ICD-10-CM | POA: Diagnosis not present

## 2020-05-15 DIAGNOSIS — D509 Iron deficiency anemia, unspecified: Secondary | ICD-10-CM | POA: Diagnosis not present

## 2020-05-15 DIAGNOSIS — N186 End stage renal disease: Secondary | ICD-10-CM | POA: Diagnosis not present

## 2020-05-15 DIAGNOSIS — Z992 Dependence on renal dialysis: Secondary | ICD-10-CM | POA: Diagnosis not present

## 2020-05-16 DIAGNOSIS — N186 End stage renal disease: Secondary | ICD-10-CM | POA: Diagnosis not present

## 2020-05-16 DIAGNOSIS — Z992 Dependence on renal dialysis: Secondary | ICD-10-CM | POA: Diagnosis not present

## 2020-05-22 DIAGNOSIS — N186 End stage renal disease: Secondary | ICD-10-CM | POA: Diagnosis not present

## 2020-05-22 DIAGNOSIS — Z992 Dependence on renal dialysis: Secondary | ICD-10-CM | POA: Diagnosis not present

## 2020-05-24 DIAGNOSIS — N186 End stage renal disease: Secondary | ICD-10-CM | POA: Diagnosis not present

## 2020-05-24 DIAGNOSIS — Z992 Dependence on renal dialysis: Secondary | ICD-10-CM | POA: Diagnosis not present

## 2020-05-25 DIAGNOSIS — Z992 Dependence on renal dialysis: Secondary | ICD-10-CM | POA: Diagnosis not present

## 2020-05-25 DIAGNOSIS — N186 End stage renal disease: Secondary | ICD-10-CM | POA: Diagnosis not present

## 2020-05-26 DIAGNOSIS — Z992 Dependence on renal dialysis: Secondary | ICD-10-CM | POA: Diagnosis not present

## 2020-05-26 DIAGNOSIS — N186 End stage renal disease: Secondary | ICD-10-CM | POA: Diagnosis not present

## 2020-05-27 DIAGNOSIS — N186 End stage renal disease: Secondary | ICD-10-CM | POA: Diagnosis not present

## 2020-05-27 DIAGNOSIS — Z992 Dependence on renal dialysis: Secondary | ICD-10-CM | POA: Diagnosis not present

## 2020-05-28 DIAGNOSIS — Z992 Dependence on renal dialysis: Secondary | ICD-10-CM | POA: Diagnosis not present

## 2020-05-28 DIAGNOSIS — N186 End stage renal disease: Secondary | ICD-10-CM | POA: Diagnosis not present

## 2020-05-29 DIAGNOSIS — Z992 Dependence on renal dialysis: Secondary | ICD-10-CM | POA: Diagnosis not present

## 2020-05-29 DIAGNOSIS — N186 End stage renal disease: Secondary | ICD-10-CM | POA: Diagnosis not present

## 2020-05-30 DIAGNOSIS — N186 End stage renal disease: Secondary | ICD-10-CM | POA: Diagnosis not present

## 2020-05-30 DIAGNOSIS — Z992 Dependence on renal dialysis: Secondary | ICD-10-CM | POA: Diagnosis not present

## 2020-05-31 DIAGNOSIS — N186 End stage renal disease: Secondary | ICD-10-CM | POA: Diagnosis not present

## 2020-05-31 DIAGNOSIS — Z992 Dependence on renal dialysis: Secondary | ICD-10-CM | POA: Diagnosis not present

## 2020-06-01 DIAGNOSIS — N186 End stage renal disease: Secondary | ICD-10-CM | POA: Diagnosis not present

## 2020-06-01 DIAGNOSIS — Z992 Dependence on renal dialysis: Secondary | ICD-10-CM | POA: Diagnosis not present

## 2020-06-02 ENCOUNTER — Other Ambulatory Visit: Payer: Self-pay | Admitting: Internal Medicine

## 2020-06-02 DIAGNOSIS — N186 End stage renal disease: Secondary | ICD-10-CM | POA: Diagnosis not present

## 2020-06-02 DIAGNOSIS — Z992 Dependence on renal dialysis: Secondary | ICD-10-CM | POA: Diagnosis not present

## 2020-06-03 DIAGNOSIS — Z992 Dependence on renal dialysis: Secondary | ICD-10-CM | POA: Diagnosis not present

## 2020-06-03 DIAGNOSIS — N186 End stage renal disease: Secondary | ICD-10-CM | POA: Diagnosis not present

## 2020-06-04 DIAGNOSIS — Z992 Dependence on renal dialysis: Secondary | ICD-10-CM | POA: Diagnosis not present

## 2020-06-04 DIAGNOSIS — N186 End stage renal disease: Secondary | ICD-10-CM | POA: Diagnosis not present

## 2020-06-05 DIAGNOSIS — Z992 Dependence on renal dialysis: Secondary | ICD-10-CM | POA: Diagnosis not present

## 2020-06-05 DIAGNOSIS — N186 End stage renal disease: Secondary | ICD-10-CM | POA: Diagnosis not present

## 2020-06-06 DIAGNOSIS — N186 End stage renal disease: Secondary | ICD-10-CM | POA: Diagnosis not present

## 2020-06-06 DIAGNOSIS — Z992 Dependence on renal dialysis: Secondary | ICD-10-CM | POA: Diagnosis not present

## 2020-06-07 DIAGNOSIS — Z992 Dependence on renal dialysis: Secondary | ICD-10-CM | POA: Diagnosis not present

## 2020-06-07 DIAGNOSIS — N186 End stage renal disease: Secondary | ICD-10-CM | POA: Diagnosis not present

## 2020-06-08 DIAGNOSIS — Z992 Dependence on renal dialysis: Secondary | ICD-10-CM | POA: Diagnosis not present

## 2020-06-08 DIAGNOSIS — N186 End stage renal disease: Secondary | ICD-10-CM | POA: Diagnosis not present

## 2020-06-09 DIAGNOSIS — Z992 Dependence on renal dialysis: Secondary | ICD-10-CM | POA: Diagnosis not present

## 2020-06-09 DIAGNOSIS — N186 End stage renal disease: Secondary | ICD-10-CM | POA: Diagnosis not present

## 2020-06-10 DIAGNOSIS — Z992 Dependence on renal dialysis: Secondary | ICD-10-CM | POA: Diagnosis not present

## 2020-06-10 DIAGNOSIS — N186 End stage renal disease: Secondary | ICD-10-CM | POA: Diagnosis not present

## 2020-06-11 DIAGNOSIS — N186 End stage renal disease: Secondary | ICD-10-CM | POA: Diagnosis not present

## 2020-06-11 DIAGNOSIS — Z992 Dependence on renal dialysis: Secondary | ICD-10-CM | POA: Diagnosis not present

## 2020-06-12 DIAGNOSIS — Z992 Dependence on renal dialysis: Secondary | ICD-10-CM | POA: Diagnosis not present

## 2020-06-12 DIAGNOSIS — N186 End stage renal disease: Secondary | ICD-10-CM | POA: Diagnosis not present

## 2020-06-13 DIAGNOSIS — Z992 Dependence on renal dialysis: Secondary | ICD-10-CM | POA: Diagnosis not present

## 2020-06-13 DIAGNOSIS — N186 End stage renal disease: Secondary | ICD-10-CM | POA: Diagnosis not present

## 2020-06-14 DIAGNOSIS — N186 End stage renal disease: Secondary | ICD-10-CM | POA: Diagnosis not present

## 2020-06-14 DIAGNOSIS — Z992 Dependence on renal dialysis: Secondary | ICD-10-CM | POA: Diagnosis not present

## 2020-06-15 DIAGNOSIS — Z992 Dependence on renal dialysis: Secondary | ICD-10-CM | POA: Diagnosis not present

## 2020-06-15 DIAGNOSIS — N186 End stage renal disease: Secondary | ICD-10-CM | POA: Diagnosis not present

## 2020-06-16 DIAGNOSIS — N186 End stage renal disease: Secondary | ICD-10-CM | POA: Diagnosis not present

## 2020-06-16 DIAGNOSIS — Z992 Dependence on renal dialysis: Secondary | ICD-10-CM | POA: Diagnosis not present

## 2020-06-17 DIAGNOSIS — Z992 Dependence on renal dialysis: Secondary | ICD-10-CM | POA: Diagnosis not present

## 2020-06-17 DIAGNOSIS — N186 End stage renal disease: Secondary | ICD-10-CM | POA: Diagnosis not present

## 2020-06-18 DIAGNOSIS — Z992 Dependence on renal dialysis: Secondary | ICD-10-CM | POA: Diagnosis not present

## 2020-06-18 DIAGNOSIS — N186 End stage renal disease: Secondary | ICD-10-CM | POA: Diagnosis not present

## 2020-06-19 DIAGNOSIS — Z992 Dependence on renal dialysis: Secondary | ICD-10-CM | POA: Diagnosis not present

## 2020-06-19 DIAGNOSIS — N186 End stage renal disease: Secondary | ICD-10-CM | POA: Diagnosis not present

## 2020-06-26 DIAGNOSIS — N186 End stage renal disease: Secondary | ICD-10-CM | POA: Diagnosis not present

## 2020-09-11 IMAGING — MG DIGITAL SCREENING BILAT W/ TOMO W/ CAD
8 series · 8 of 24 positions shown · non-contrast
Comparison: Previous exam(s).

CLINICAL DATA: Screening.

EXAM:
DIGITAL SCREENING BILATERAL MAMMOGRAM WITH TOMO AND CAD

[L CC synth-2D]
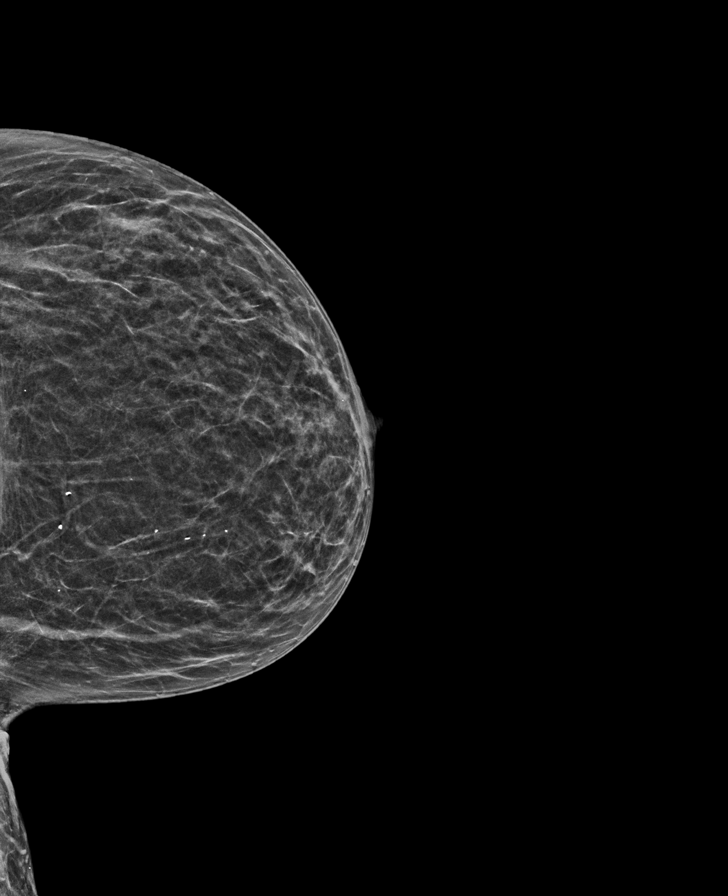

[R MLO synth-2D]
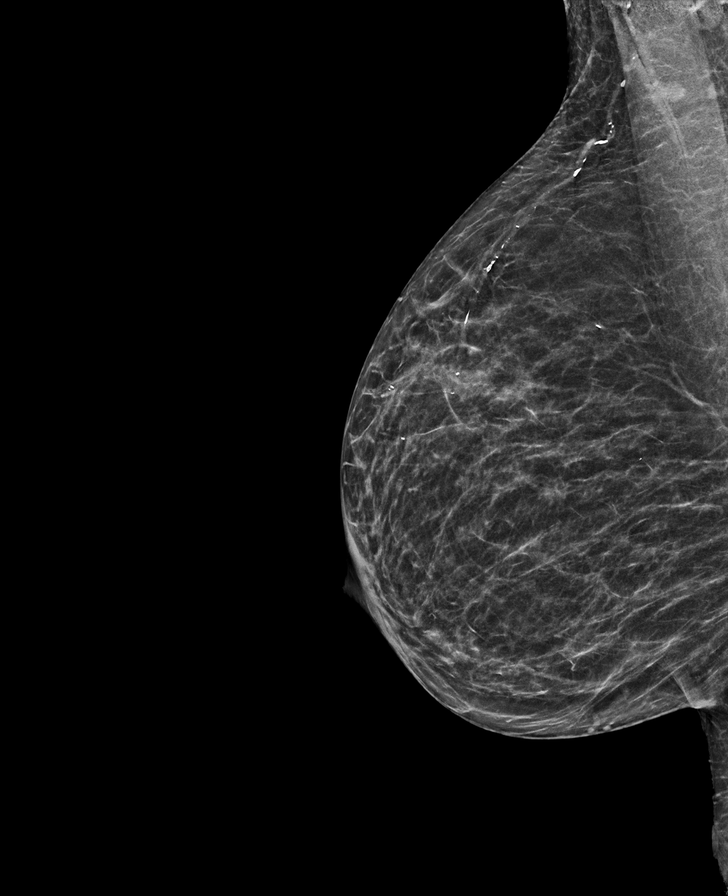

[L MLO synth-2D]
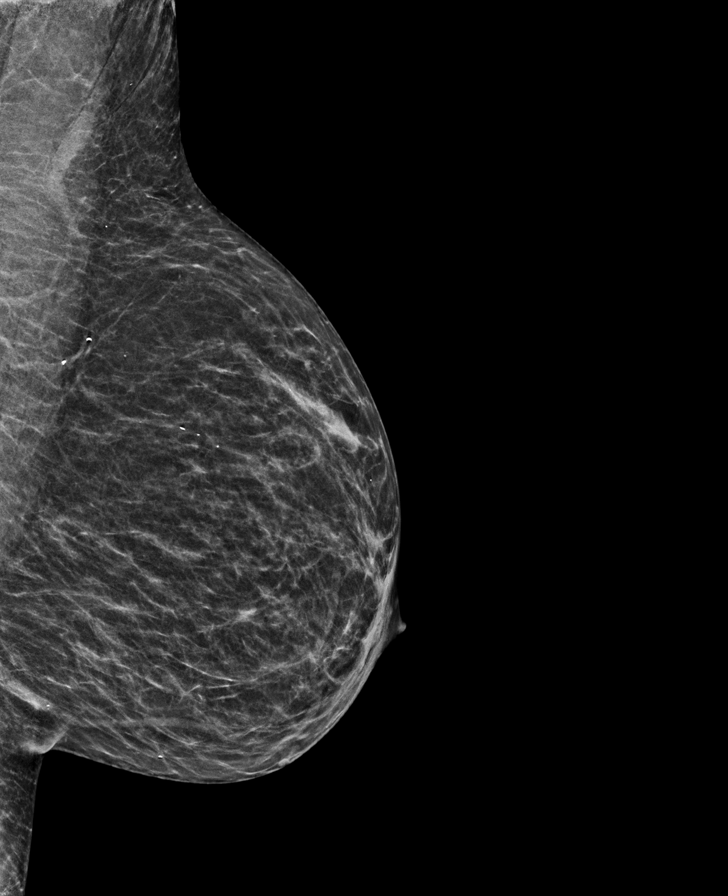

[R CC synth-2D]
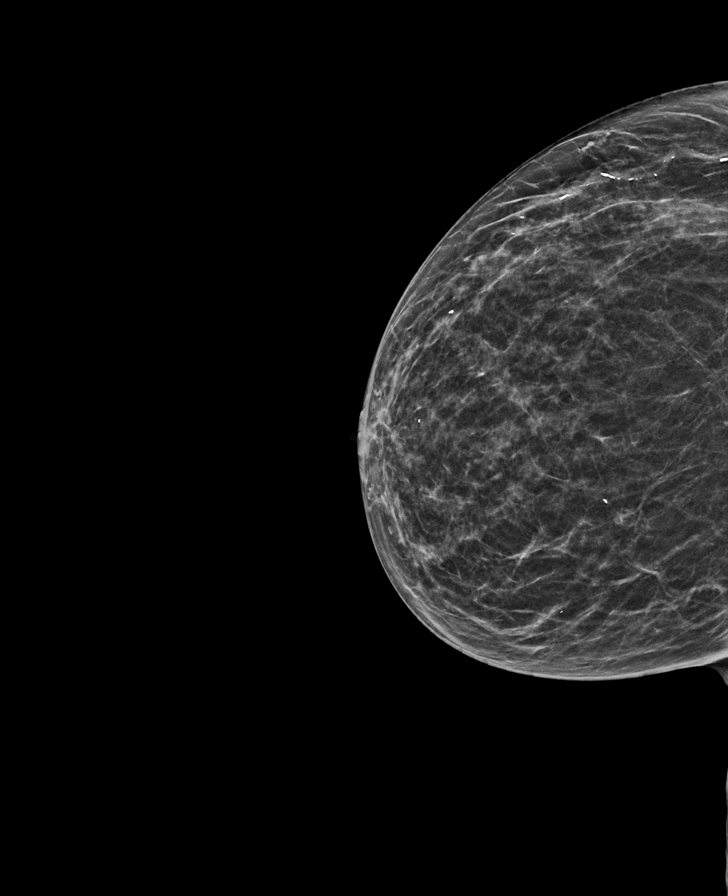

[R MLO tomo · tomo slice 27/53.0]
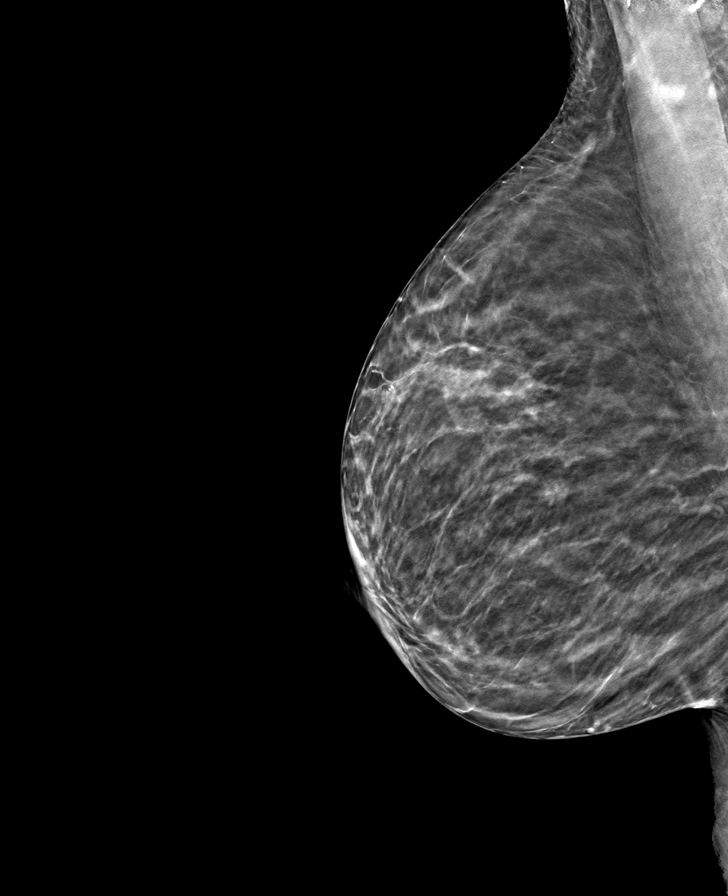

[R CC tomo · tomo slice 25/49.0]
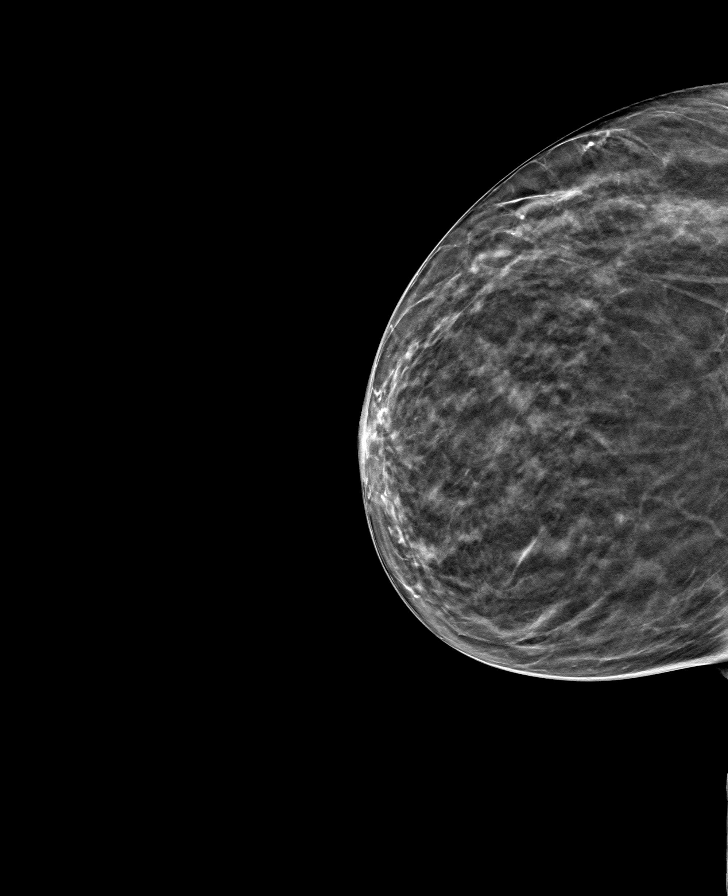

[L CC tomo · tomo slice 25/49.0]
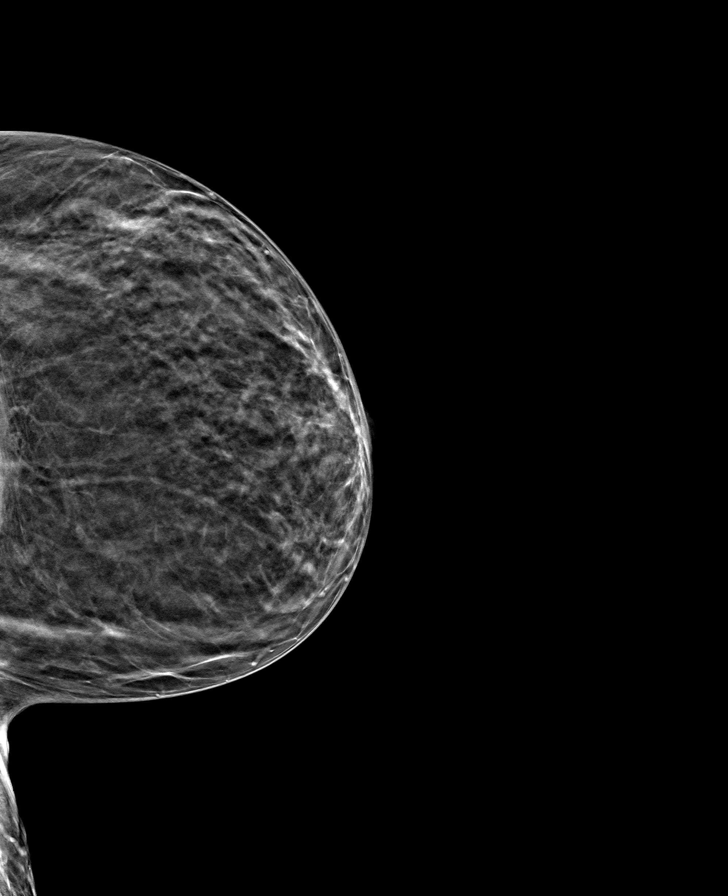

[L MLO tomo · tomo slice 25/50.0]
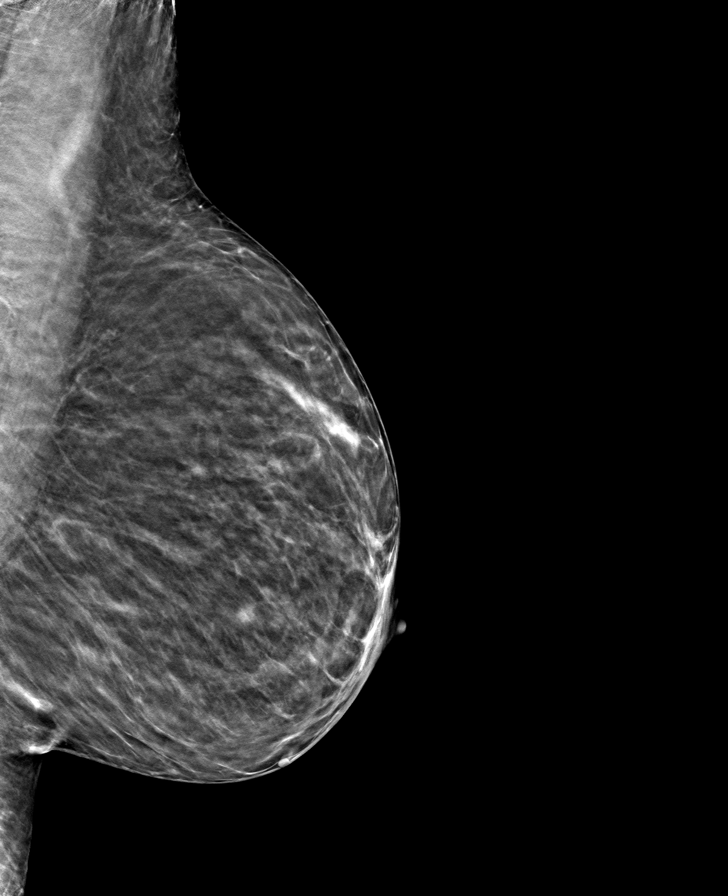

[8 of 24 positions shown; findings below may reference images not displayed]

ACR Breast Density Category b: There are scattered areas of
fibroglandular density.
FINDINGS: There are no findings suspicious for malignancy. Images were
processed with CAD.
IMPRESSION: No mammographic evidence of malignancy. A result letter of this
screening mammogram will be mailed directly to the patient.

RECOMMENDATION:
Screening mammogram in one year. (Code:[id number])

BI-RADS CATEGORY  1: Negative.
